# Patient Record
Sex: Female | Born: 1940
Health system: Southern US, Community
[De-identification: ages and names within clinical notes are randomized; demographics above are authoritative.]

## PROBLEM LIST (undated history)

## (undated) DIAGNOSIS — M51369 Other intervertebral disc degeneration, lumbar region without mention of lumbar back pain or lower extremity pain: Secondary | ICD-10-CM

## (undated) DIAGNOSIS — E785 Hyperlipidemia, unspecified: Secondary | ICD-10-CM

## (undated) DIAGNOSIS — C50911 Malignant neoplasm of unspecified site of right female breast: Secondary | ICD-10-CM

## (undated) DIAGNOSIS — K219 Gastro-esophageal reflux disease without esophagitis: Secondary | ICD-10-CM

## (undated) DIAGNOSIS — M797 Fibromyalgia: Secondary | ICD-10-CM

## (undated) DIAGNOSIS — K649 Unspecified hemorrhoids: Secondary | ICD-10-CM

## (undated) DIAGNOSIS — N83209 Unspecified ovarian cyst, unspecified side: Secondary | ICD-10-CM

## (undated) DIAGNOSIS — F419 Anxiety disorder, unspecified: Secondary | ICD-10-CM

## (undated) DIAGNOSIS — M5136 Other intervertebral disc degeneration, lumbar region: Secondary | ICD-10-CM

## (undated) DIAGNOSIS — E119 Type 2 diabetes mellitus without complications: Secondary | ICD-10-CM

## (undated) DIAGNOSIS — Z8673 Personal history of transient ischemic attack (TIA), and cerebral infarction without residual deficits: Secondary | ICD-10-CM

## (undated) DIAGNOSIS — I10 Essential (primary) hypertension: Secondary | ICD-10-CM

## (undated) HISTORY — DX: Other intervertebral disc degeneration, lumbar region without mention of lumbar back pain or lower extremity pain: M51.369

## (undated) HISTORY — DX: Unspecified hemorrhoids: K64.9

## (undated) HISTORY — DX: Other intervertebral disc degeneration, lumbar region: M51.36

## (undated) HISTORY — PX: BREAST LUMPECTOMY: SHX2

## (undated) HISTORY — PX: CATARACT EXTRACTION: SUR2

## (undated) HISTORY — PX: BREAST BIOPSY: SHX20

## (undated) HISTORY — DX: Unspecified ovarian cyst, unspecified side: N83.209

## (undated) HISTORY — DX: Personal history of transient ischemic attack (TIA), and cerebral infarction without residual deficits: Z86.73

---

## 1973-06-19 HISTORY — PX: TUBAL LIGATION: SHX77

## 2001-02-08 ENCOUNTER — Encounter: Admission: RE | Admit: 2001-02-08 | Discharge: 2001-02-08 | Payer: Self-pay | Admitting: Family Medicine

## 2001-02-08 ENCOUNTER — Encounter: Payer: Self-pay | Admitting: Family Medicine

## 2012-04-19 DIAGNOSIS — Z8673 Personal history of transient ischemic attack (TIA), and cerebral infarction without residual deficits: Secondary | ICD-10-CM

## 2012-04-19 HISTORY — DX: Personal history of transient ischemic attack (TIA), and cerebral infarction without residual deficits: Z86.73

## 2012-04-29 ENCOUNTER — Emergency Department (HOSPITAL_COMMUNITY)
Admission: EM | Admit: 2012-04-29 | Discharge: 2012-04-29 | Disposition: A | Discharge status: Home / Self Care | Payer: Medicare Other | Attending: Emergency Medicine | Admitting: Emergency Medicine

## 2012-04-29 ENCOUNTER — Emergency Department (HOSPITAL_COMMUNITY): Payer: Medicare Other

## 2012-04-29 ENCOUNTER — Encounter (HOSPITAL_COMMUNITY): Payer: Self-pay | Admitting: Emergency Medicine

## 2012-04-29 DIAGNOSIS — F411 Generalized anxiety disorder: Secondary | ICD-10-CM | POA: Insufficient documentation

## 2012-04-29 DIAGNOSIS — R05 Cough: Secondary | ICD-10-CM | POA: Insufficient documentation

## 2012-04-29 DIAGNOSIS — IMO0001 Reserved for inherently not codable concepts without codable children: Secondary | ICD-10-CM | POA: Insufficient documentation

## 2012-04-29 DIAGNOSIS — M6281 Muscle weakness (generalized): Secondary | ICD-10-CM | POA: Insufficient documentation

## 2012-04-29 DIAGNOSIS — R202 Paresthesia of skin: Secondary | ICD-10-CM

## 2012-04-29 DIAGNOSIS — R209 Unspecified disturbances of skin sensation: Secondary | ICD-10-CM | POA: Insufficient documentation

## 2012-04-29 DIAGNOSIS — Z79899 Other long term (current) drug therapy: Secondary | ICD-10-CM | POA: Insufficient documentation

## 2012-04-29 DIAGNOSIS — R51 Headache: Secondary | ICD-10-CM

## 2012-04-29 DIAGNOSIS — R059 Cough, unspecified: Secondary | ICD-10-CM | POA: Insufficient documentation

## 2012-04-29 DIAGNOSIS — E785 Hyperlipidemia, unspecified: Secondary | ICD-10-CM | POA: Insufficient documentation

## 2012-04-29 DIAGNOSIS — B379 Candidiasis, unspecified: Secondary | ICD-10-CM

## 2012-04-29 DIAGNOSIS — M542 Cervicalgia: Secondary | ICD-10-CM | POA: Insufficient documentation

## 2012-04-29 DIAGNOSIS — E119 Type 2 diabetes mellitus without complications: Secondary | ICD-10-CM | POA: Insufficient documentation

## 2012-04-29 DIAGNOSIS — I10 Essential (primary) hypertension: Secondary | ICD-10-CM | POA: Insufficient documentation

## 2012-04-29 HISTORY — DX: Type 2 diabetes mellitus without complications: E11.9

## 2012-04-29 HISTORY — DX: Essential (primary) hypertension: I10

## 2012-04-29 HISTORY — DX: Fibromyalgia: M79.7

## 2012-04-29 HISTORY — DX: Hyperlipidemia, unspecified: E78.5

## 2012-04-29 HISTORY — DX: Anxiety disorder, unspecified: F41.9

## 2012-04-29 LAB — COMPREHENSIVE METABOLIC PANEL
Alkaline Phosphatase: 68 U/L (ref 39–117)
BUN: 12 mg/dL (ref 6–23)
Chloride: 98 mEq/L (ref 96–112)
GFR calc Af Amer: >90 mL/min (ref >90)
Glucose, Bld: 142 mg/dL — ABNORMAL HIGH (ref 70–99)
Potassium: 3.9 mEq/L (ref 3.5–5.1)
Total Bilirubin: 0.2 mg/dL — ABNORMAL LOW (ref 0.3–1.2)

## 2012-04-29 LAB — CBC WITH DIFFERENTIAL/PLATELET
Eosinophils Absolute: 0.3 10*3/uL (ref 0.0–0.7)
HCT: 42.9 % (ref 36.0–46.0)
Hemoglobin: 14.7 g/dL (ref 12.0–15.0)
Lymphs Abs: 3.6 10*3/uL (ref 0.7–4.0)
MCH: 30.1 pg (ref 26.0–34.0)
Monocytes Absolute: 0.9 10*3/uL (ref 0.1–1.0)
Monocytes Relative: 7 % (ref 3–12)
Neutro Abs: 6.7 10*3/uL (ref 1.7–7.7)
Neutrophils Relative %: 59 % (ref 43–77)
RBC: 4.88 MIL/uL (ref 3.87–5.11)

## 2012-04-29 LAB — URINALYSIS, ROUTINE W REFLEX MICROSCOPIC
Bilirubin Urine: NEGATIVE
Ketones, ur: NEGATIVE mg/dL
Nitrite: NEGATIVE
Protein, ur: NEGATIVE mg/dL
Urobilinogen, UA: 0.2 mg/dL (ref 0.0–1.0)

## 2012-04-29 LAB — URINE MICROSCOPIC-ADD ON

## 2012-04-29 LAB — GLUCOSE, CAPILLARY: Glucose-Capillary: 146 mg/dL — ABNORMAL HIGH (ref 70–99)

## 2012-04-29 MED ORDER — ASPIRIN 81 MG PO CHEW
81.0000 mg | CHEWABLE_TABLET | Freq: Once | ORAL | Status: AC
  Administered 2012-04-29: 81 mg via ORAL
  Filled 2012-04-29: qty 1

## 2012-04-29 MED ORDER — FLUCONAZOLE 150 MG PO TABS
150.0000 mg | ORAL_TABLET | Freq: Once | ORAL | Status: AC
  Administered 2012-04-29: 150 mg via ORAL
  Filled 2012-04-29: qty 1

## 2012-04-29 NOTE — ED Notes (Addendum)
Pt presenting to ed with c/o headache pain since Saturday pt states she has not felt her normal self. Pt states she developed numbness and tingling to her right arm today while cooking and noticed she could not stir her cabbage. Pt denies chest pain nausea and vomiting at this time. Pt with no slurred speech at this time. Per pt's family pt sounds normal at this time. Pt with no facial droop noted at this time. Pt states she has had some periods of dizziness but denies at this time. Pt with no neuro deficits noted at this time. Pt states she had a recent change in her medication and she's not sure if this is the cause

## 2012-04-29 NOTE — ED Provider Notes (Addendum)
History     CSN: [id number]  Arrival date & time 04/29/12  1739   First MD Initiated Contact with Patient 04/29/12 1838      Chief Complaint  Patient presents with  . Headache  . Extremity Weakness    (Consider location/radiation/quality/duration/timing/severity/associated sxs/prior treatment) Patient is a 71 y.o. female presenting with headaches and extremity weakness. The history is provided by the patient.  Headache  Pertinent negatives include no fever, no palpitations, no shortness of breath, no nausea and no vomiting.  Extremity Weakness Associated symptoms include headaches. Pertinent negatives include no chest pain and no shortness of breath.   71 year old, female, presents emergency department complaining of headache, and weakness in her right arm.  She said the headache occurred 3 days ago.  It was accompanied by mild neck pain, and tingling in her right hand.  She took an analgesic and went to sleep in the next day.  The headache was resolved.  Now.  She has tingling in her right hand, and complains of decreased dexterity in her right hand.  She is right hand dominant.  She denies a headache, dizziness, vision changes.  She denies nausea, vomiting.  She has never had a stroke in the past.  She does have diabetes.  She denies prior history of myocardial infarction.  Past Medical History  Diagnosis Date  . Hypertension   . Hyperlipidemia   . Diabetes mellitus without complication   . Fibromyalgia   . Anxiety     History reviewed. No pertinent past surgical history.  No family history on file.  History  Substance Use Topics  . Smoking status: Never Smoker   . Smokeless tobacco: Not on file  . Alcohol Use: No    OB History    Grav Para Term Preterm Abortions TAB SAB Ect Mult Living                  Review of Systems  Constitutional: Negative for fever, chills and fatigue.  HENT: Positive for neck pain.        Neck pain.  3 days ago, is resolved  Eyes:  Negative for visual disturbance.  Respiratory: Positive for cough. Negative for shortness of breath.   Cardiovascular: Negative for chest pain, palpitations and leg swelling.  Gastrointestinal: Negative for nausea and vomiting.  Musculoskeletal: Positive for extremity weakness. Negative for back pain.  Skin: Negative for rash.  Neurological: Positive for weakness, numbness and headaches.       Headache resolved Numbness in right hand, along with decreased dexterity in right hand.  Both symptoms are resolved.  Now  Psychiatric/Behavioral: Negative for confusion.  All other systems reviewed and are negative.    Allergies  Ciprofloxacin; Citalopram; Codeine; and Maxzide  Home Medications   Current Outpatient Rx  Name  Route  Sig  Dispense  Refill  . ALPRAZOLAM 0.5 MG PO TABS   Oral   Take 0.5 mg by mouth 3 (three) times daily as needed. Anxiety.         Marland Kitchen DILTIAZEM HCL ER BEADS 360 MG PO CP24   Oral   Take 360 mg by mouth daily.         Marland Kitchen FLUTICASONE PROPIONATE 50 MCG/ACT NA SUSP   Nasal   Place 2 sprays into the nose daily.         Marland Kitchen GLIMEPIRIDE 2 MG PO TABS   Oral   Take 2 mg by mouth daily before breakfast.         .  PRAVASTATIN SODIUM 20 MG PO TABS   Oral   Take 20 mg by mouth daily.         . TRAMADOL HCL 50 MG PO TABS   Oral   Take 25 mg by mouth every 6 (six) hours as needed. Fibromyalgia pain.           BP 162/71  Pulse 96  Temp 98.2 F (36.8 C) (Oral)  Resp 18  SpO2 100%  Physical Exam  Nursing note and vitals reviewed. Constitutional: She is oriented to person, place, and time. She appears well-developed and well-nourished. No distress.  HENT:  Head: Normocephalic and atraumatic.  Right Ear: External ear normal.  Left Ear: External ear normal.  Mouth/Throat: Oropharynx is clear and moist. No oropharyngeal exudate.  Eyes: Conjunctivae normal and EOM are normal. Pupils are equal, round, and reactive to light.  Neck: Normal range of  motion. Neck supple. No JVD present.        No carotid bruits  Cardiovascular: Normal rate, regular rhythm and intact distal pulses.   No murmur heard. Pulmonary/Chest: Effort normal and breath sounds normal. No respiratory distress.  Abdominal: Soft. Bowel sounds are normal. She exhibits no distension. There is no tenderness.  Musculoskeletal: Normal range of motion. She exhibits no edema and no tenderness.  Neurological: She is alert and oriented to person, place, and time. No cranial nerve deficit. Coordination normal.       Strength 5 over 5 bilateral upper extremities.  Normal.  Light touch sensation in bilateral upper and lower extremity  Skin: Skin is warm and dry.  Psychiatric: She has a normal mood and affect. Thought content normal.    ED Course  Procedures (including critical care time) 71 year old, female, with diabetes mellitus, presents with a history of headache, neck pain, and numbness in right hand, with decreased dexterity.  Presently, her symptoms are resolved.  She has a normal mental status and normal.  Neurological examination.  Labs Reviewed  GLUCOSE, CAPILLARY - Abnormal; Notable for the following:    Glucose-Capillary 146 (*)     All other components within normal limits  CBC WITH DIFFERENTIAL  COMPREHENSIVE METABOLIC PANEL  URINALYSIS, ROUTINE W REFLEX MICROSCOPIC   No results found.   No diagnosis found.   Date: 04/29/2012  Rate: 94  Rhythm: normal sinus rhythm  QRS Axis: normal  Intervals: normal  ST/T Wave abnormalities: normal  Conduction Disutrbances: none  Narrative Interpretation: unremarkable  9:08 PM Still asx.  Will discuss with pcp, dr. Tiburcio Pea. [facility].  9:15 PM I spoke with Dr. Azucena Kuba.  He agreed with outpt workup for possible tia since pt is normal now and asx.  He will arrange f/u in office.   9:26 PM More hx.  Pt states she has vaginal itching.  She has dm.   This explains wbcs in urine with few bact and no rbcs MDM  Headache,  with right hand, numbness and decreased dexterity, resolved        Cheri Guppy, MD 04/29/12 1901  Cheri Guppy, MD 04/29/12 2100  Cheri Guppy, MD 04/29/12 9811  Cheri Guppy, MD 04/29/12 2127

## 2012-04-30 ENCOUNTER — Other Ambulatory Visit: Payer: Self-pay | Admitting: Family Medicine

## 2012-04-30 DIAGNOSIS — G459 Transient cerebral ischemic attack, unspecified: Secondary | ICD-10-CM

## 2012-05-01 ENCOUNTER — Ambulatory Visit
Admission: RE | Admit: 2012-05-01 | Discharge: 2012-05-01 | Disposition: A | Discharge status: Home / Self Care | Payer: Medicare Other | Source: Ambulatory Visit | Attending: Family Medicine | Admitting: Family Medicine

## 2012-05-01 DIAGNOSIS — G459 Transient cerebral ischemic attack, unspecified: Secondary | ICD-10-CM

## 2012-05-01 LAB — URINE CULTURE
Colony Count: NO GROWTH
Culture: NO GROWTH

## 2012-05-05 ENCOUNTER — Ambulatory Visit
Admission: RE | Admit: 2012-05-05 | Discharge: 2012-05-05 | Disposition: A | Discharge status: Home / Self Care | Payer: Medicare Other | Source: Ambulatory Visit | Attending: Family Medicine | Admitting: Family Medicine

## 2012-05-05 DIAGNOSIS — G459 Transient cerebral ischemic attack, unspecified: Secondary | ICD-10-CM

## 2012-07-26 ENCOUNTER — Ambulatory Visit
Admission: RE | Admit: 2012-07-26 | Discharge: 2012-07-26 | Disposition: A | Discharge status: Home / Self Care | Payer: Medicare Other | Source: Ambulatory Visit | Attending: Family Medicine | Admitting: Family Medicine

## 2012-07-26 ENCOUNTER — Other Ambulatory Visit: Payer: Self-pay | Admitting: Family Medicine

## 2012-07-26 DIAGNOSIS — J40 Bronchitis, not specified as acute or chronic: Secondary | ICD-10-CM

## 2012-07-26 DIAGNOSIS — R05 Cough: Secondary | ICD-10-CM

## 2012-07-26 DIAGNOSIS — R053 Chronic cough: Secondary | ICD-10-CM

## 2012-10-25 ENCOUNTER — Telehealth: Payer: Self-pay | Admitting: *Deleted

## 2012-10-25 NOTE — Telephone Encounter (Signed)
Patient will call to R/S her appointment patient need to find a ride.

## 2012-12-04 ENCOUNTER — Ambulatory Visit: Payer: Self-pay | Admitting: Neurology

## 2012-12-11 ENCOUNTER — Encounter: Payer: Self-pay | Admitting: Neurology

## 2012-12-11 ENCOUNTER — Ambulatory Visit (INDEPENDENT_AMBULATORY_CARE_PROVIDER_SITE_OTHER): Payer: Medicare Other | Admitting: Neurology

## 2012-12-11 VITALS — BP 172/96 | HR 96 | Ht 62.0 in | Wt 157.0 lb

## 2012-12-11 DIAGNOSIS — I6350 Cerebral infarction due to unspecified occlusion or stenosis of unspecified cerebral artery: Secondary | ICD-10-CM

## 2012-12-11 DIAGNOSIS — I10 Essential (primary) hypertension: Secondary | ICD-10-CM

## 2012-12-11 DIAGNOSIS — E119 Type 2 diabetes mellitus without complications: Secondary | ICD-10-CM

## 2012-12-11 DIAGNOSIS — I639 Cerebral infarction, unspecified: Secondary | ICD-10-CM

## 2012-12-11 NOTE — Progress Notes (Signed)
History of Present Illness:   Christina [name] is a 72 years old right-handed Caucasian female, referred by her primary care physician Dr. Tiburcio Pea for evaluation of acute stroke  She had a vascular risk factor of hypertension, hyperlipidemia, diabetes, most recent A1c was 7 point 5, LDL 64, she lives alone, but quit driving few years ago, ambulate without difficulty at baseline  In April 29 2012, while fixing supper, she noticed clumsiness of her right hand, drop things, also numbness involving her right hand, extending to her right arm, symptoms overall has improved over the past 1 months, she has residual mild clumsiness of her right hand, difficulty signing her name, fingertips numbness tingling, she denies gait difficulty, no dysarthria,  She was referred for MRI of the brain, done at [facility] May 05 2012, there was no acute right dorsal medullary infarction, subacute right inferior cerebellum infarction, mild to moderate supratentorium small vessel disease, carotid Doppler, mild plaque, no significant stenosis, echocardiogram was normal, she denies a history of irregular heart rate. She was put on aspirin 81 mg  UPDATE June 24th 2014: She has numbness at her right finger tips, she denies weakness, she is able to carry grocery using her right hand, she denies gait difficulty, she also has right knee bursitis, complains of right knee pain, mild gait difficulty.   Physical Exam  Neck: supple no carotid bruits Respiratory: clear to auscultation bilaterally Cardiovascular: regular rate rhythm  Neurologic Exam  Mental Status: pleasant, awake, alert, cooperative to history, talking, and casual conversation. Cranial Nerves: CN II-XII pupils were equal round reactive to light.  Fundi were sharp bilaterally.  Extraocular movements were full.  Visual fields were full on confrontational test.  Facial sensation and strength were normal.  Hearing was intact to finger rubbing bilaterally.   Uvula tongue were midline.  Head turning and shoulder shrugging were normal and symmetric.  Tongue protrusion into the cheeks strength were normal.  Motor: Normal tone, bulk, and strength, with exception of mild weak grip of her right hand 5-. Sensory: Normal to light touch, pinprick, proprioception, and vibratory sensation. Coordination: Normal finger-to-nose, heel-to-shin.  There was no dysmetria noticed. Gait and Station: Narrow based and steady, was able to perform tiptoe, heel, and tandem walking without difficulty.  Romberg sign: Negative Reflexes: Deep tendon reflexes: Biceps: 2/2, Brachioradialis: 2/2, Triceps: 2/2, Pateller: 2/2, Achilles: 2/2.  Plantar responses are flexor.   Assessment and Plan:  72 years old right-handed Caucasian female, with vascular risk factor of hypertension, diabetes, hyperlipidemia, presenting with acute onset right arm weakness, and numbness, overall has improved, MRI of brain 6 days after symptom onset has demonstrate an acute stroke in the right dorsal medulla, there was also evidence of subacute right cerebellum stroke, chronic supratentorial small vessel disease, echocardiogram was normal, carotid Doppler showed mild plaques, no significant stenosis, she is overall doing well, no recurrent symptoms, highly functional.  1. continue daily aspirin 2.  Her diabetes is under better control now 3. exercise, hydration, goal blood pressure less than 130/80, LDL less than 70 4. Return to clinic as needed.

## 2013-04-19 DIAGNOSIS — N83209 Unspecified ovarian cyst, unspecified side: Secondary | ICD-10-CM

## 2013-04-19 HISTORY — DX: Unspecified ovarian cyst, unspecified side: N83.209

## 2013-08-03 ENCOUNTER — Ambulatory Visit (INDEPENDENT_AMBULATORY_CARE_PROVIDER_SITE_OTHER): Payer: Medicare Other | Admitting: Emergency Medicine

## 2013-08-03 ENCOUNTER — Other Ambulatory Visit: Payer: Self-pay | Admitting: *Deleted

## 2013-08-03 VITALS — BP 140/80 | HR 88 | Temp 98.0°F | Resp 16 | Ht 63.5 in | Wt 151.1 lb

## 2013-08-03 DIAGNOSIS — M5430 Sciatica, unspecified side: Secondary | ICD-10-CM

## 2013-08-03 MED ORDER — HYDROCODONE-ACETAMINOPHEN 5-325 MG PO TABS: ORAL_TABLET | ORAL | Status: DC

## 2013-08-03 MED ORDER — ACETAMINOPHEN-CODEINE #3 300-30 MG PO TABS: 1.0000 | ORAL_TABLET | ORAL | Status: DC | PRN

## 2013-08-03 NOTE — Progress Notes (Signed)
[facility] [address], [place] [address] [phone]  Date:  08/03/2013   Name:  Christina [name]   DOB:  June 11, 1941   MRN:  [id number]  PCP:  Shirline Frees, MD    Chief Complaint: Hip Pain   History of Present Illness:  Christina [name] is a 73 y.o. very pleasant female patient who presents with the following:  History of shingles this past year.  Coincidentally, she developed pain in the left buttock that radiates into her left leg to the ankle.   Pain increases with walking and standing. pain worse with sneezing.  No incontinence.  Pain less with sitting.  Had MRI which they believe to have been negative.  Has been evaluated by neurologist and neurosurgeon.  No notes are available.  No history of injury or overuse.  Pain present intermittently over past 8 months or so.  Now has worsened.  No improvement with over the counter medications or other home remedies. Denies other complaint or health concern today.   Patient Active Problem List   Diagnosis Date Noted  . Stroke 12/11/2012  . Essential hypertension, benign 12/11/2012  . DM (diabetes mellitus) 12/11/2012    Past Medical History  Diagnosis Date  . Hypertension   . Hyperlipidemia   . Diabetes mellitus without complication   . Fibromyalgia   . Anxiety     Past Surgical History  Procedure Laterality Date  . Cataract extraction Bilateral     History  Substance Use Topics  . Smoking status: Never Smoker   . Smokeless tobacco: Never Used  . Alcohol Use: No    Family History  Problem Relation Age of Onset  . Lung cancer Father   . Heart Problems Mother   . High blood pressure Mother     Allergies  Allergen Reactions  . Ciprofloxacin   . Citalopram   . Codeine   . Maxzide [Hydrochlorothiazide W-Triamterene]     Medication list has been reviewed and updated.  Current Outpatient Prescriptions on File Prior to Visit  Medication Sig Dispense Refill  . ALPRAZolam (XANAX) 0.5 MG  tablet Take 0.5 mg by mouth 3 (three) times daily as needed. Anxiety.      Marland Kitchen aspirin 81 MG tablet Take 81 mg by mouth daily.      Marland Kitchen diltiazem (CARDIZEM CD) 360 MG 24 hr capsule 360 mg daily.      Marland Kitchen diltiazem (TIAZAC) 360 MG 24 hr capsule Take 360 mg by mouth daily.      . fluticasone (FLONASE) 50 MCG/ACT nasal spray Place 2 sprays into the nose as needed.       Marland Kitchen glimepiride (AMARYL) 2 MG tablet Take 2 mg by mouth daily before breakfast.      . metFORMIN (GLUCOPHAGE-XR) 500 MG 24 hr tablet 500 mg daily.      . pravastatin (PRAVACHOL) 20 MG tablet Take 20 mg by mouth daily.      . traMADol (ULTRAM) 50 MG tablet Take 25 mg by mouth every 6 (six) hours as needed. Fibromyalgia pain.       No current facility-administered medications on file prior to visit.    Review of Systems:  As per HPI, otherwise negative.    Physical Examination: Filed Vitals:   08/03/13 1449  BP: 140/80  Pulse: 88  Temp: 98 F (36.7 C)  Resp: 16   Filed Vitals:   08/03/13 1449  Height: 5' 3.5" (1.613 m)  Weight: 151 lb 2 oz (68.55  kg)   Body mass index is 26.35 kg/(m^2). Ideal Body Weight: Weight in (lb) to have BMI = 25: 143.1  GEN: WDWN, NAD, Non-toxic, A & O x 3 HEENT: Atraumatic, Normocephalic. Neck supple. No masses, No LAD. Ears and Nose: No external deformity. CV: RRR, No M/G/R. No JVD. No thrill. No extra heart sounds. PULM: CTA B, no wheezes, crackles, rhonchi. No retractions. No resp. distress. No accessory muscle use. ABD: S, NT, ND, +BS. No rebound. No HSM. EXTR: No c/c/e NEURO antalgic gait.  Left lower hyperreflexic and some weakness.   PSYCH: Normally interactive. Conversant. Not depressed or anxious appearing.  Calm demeanor.  Left buttock:  Tender sciatic notch.  Assessment and Plan: Sciatic neuritis Tylenol #3 Follow up FMD   Signed,  Ellison Carwin, MD

## 2013-08-03 NOTE — Patient Instructions (Signed)
Sciatica °Sciatica is pain, weakness, numbness, or tingling along the path of the sciatic nerve. The nerve starts in the lower back and runs down the back of each leg. The nerve controls the muscles in the lower leg and in the back of the knee, while also providing sensation to the back of the thigh, lower leg, and the sole of your foot. Sciatica is a symptom of another medical condition. For instance, nerve damage or certain conditions, such as a herniated disk or bone spur on the spine, pinch or put pressure on the sciatic nerve. This causes the pain, weakness, or other sensations normally associated with sciatica. Generally, sciatica only affects one side of the body. °CAUSES  °· Herniated or slipped disc. °· Degenerative disk disease. °· A pain disorder involving the narrow muscle in the buttocks (piriformis syndrome). °· Pelvic injury or fracture. °· Pregnancy. °· Tumor (rare). °SYMPTOMS  °Symptoms can vary from mild to very severe. The symptoms usually travel from the low back to the buttocks and down the back of the leg. Symptoms can include: °· Mild tingling or dull aches in the lower back, leg, or hip. °· Numbness in the back of the calf or sole of the foot. °· Burning sensations in the lower back, leg, or hip. °· Sharp pains in the lower back, leg, or hip. °· Leg weakness. °· Severe back pain inhibiting movement. °These symptoms may get worse with coughing, sneezing, laughing, or prolonged sitting or standing. Also, being overweight may worsen symptoms. °DIAGNOSIS  °Your caregiver will perform a physical exam to look for common symptoms of sciatica. He or she may ask you to do certain movements or activities that would trigger sciatic nerve pain. Other tests may be performed to find the cause of the sciatica. These may include: °· Blood tests. °· X-rays. °· Imaging tests, such as an MRI or CT scan. °TREATMENT  °Treatment is directed at the cause of the sciatic pain. Sometimes, treatment is not necessary  and the pain and discomfort goes away on its own. If treatment is needed, your caregiver may suggest: °· Over-the-counter medicines to relieve pain. °· Prescription medicines, such as anti-inflammatory medicine, muscle relaxants, or narcotics. °· Applying heat or ice to the painful area. °· Steroid injections to lessen pain, irritation, and inflammation around the nerve. °· Reducing activity during periods of pain. °· Exercising and stretching to strengthen your abdomen and improve flexibility of your spine. Your caregiver may suggest losing weight if the extra weight makes the back pain worse. °· Physical therapy. °· Surgery to eliminate what is pressing or pinching the nerve, such as a bone spur or part of a herniated disk. °HOME CARE INSTRUCTIONS  °· Only take over-the-counter or prescription medicines for pain or discomfort as directed by your caregiver. °· Apply ice to the affected area for 20 minutes, 3 4 times a day for the first 48 72 hours. Then try heat in the same way. °· Exercise, stretch, or perform your usual activities if these do not aggravate your pain. °· Attend physical therapy sessions as directed by your caregiver. °· Keep all follow-up appointments as directed by your caregiver. °· Do not wear high heels or shoes that do not provide proper support. °· Check your mattress to see if it is too soft. A firm mattress may lessen your pain and discomfort. °SEEK IMMEDIATE MEDICAL CARE IF:  °· You lose control of your bowel or bladder (incontinence). °· You have increasing weakness in the lower back,   pelvis, buttocks, or legs. °· You have redness or swelling of your back. °· You have a burning sensation when you urinate. °· You have pain that gets worse when you lie down or awakens you at night. °· Your pain is worse than you have experienced in the past. °· Your pain is lasting longer than 4 weeks. °· You are suddenly losing weight without reason. °MAKE SURE YOU: °· Understand these  instructions. °· Will watch your condition. °· Will get help right away if you are not doing well or get worse. °Document Released: 05/30/2001 Document Revised: 12/05/2011 Document Reviewed: 10/15/2011 °[employer]® Patient Information ©2014 [employer]. ° °

## 2013-12-31 ENCOUNTER — Telehealth: Payer: Self-pay | Admitting: Family Medicine

## 2013-12-31 NOTE — Telephone Encounter (Signed)
Where not patients PCP gets medicine from somewhere else

## 2014-03-31 ENCOUNTER — Encounter: Payer: Self-pay | Admitting: General Surgery

## 2014-03-31 DIAGNOSIS — E785 Hyperlipidemia, unspecified: Secondary | ICD-10-CM

## 2014-12-28 ENCOUNTER — Encounter: Payer: Self-pay | Admitting: Gynecologic Oncology

## 2014-12-28 ENCOUNTER — Ambulatory Visit: Payer: Medicare Other | Attending: Gynecologic Oncology | Admitting: Gynecologic Oncology

## 2014-12-28 VITALS — BP 169/80 | HR 109 | Temp 97.5°F | Resp 18 | Ht 63.0 in | Wt 155.9 lb

## 2014-12-28 DIAGNOSIS — N83209 Unspecified ovarian cyst, unspecified side: Secondary | ICD-10-CM

## 2014-12-28 DIAGNOSIS — N8320 Unspecified ovarian cysts: Secondary | ICD-10-CM | POA: Diagnosis present

## 2014-12-28 NOTE — Patient Instructions (Signed)
Please call Dr. Deatra Ina to schedule a repeat ultrasound and blood test in 3 months.  We will review the results and call to discuss next steps in your care. Please call our clinic before that time for any questions or new or worsening symptoms.

## 2014-12-28 NOTE — Progress Notes (Signed)
Consult Note: Gyn-Onc  Consult was requested by Dr. Deatra Ina for the evaluation of Christina [name] 74 y.o. female with a 10cm ovarian cyst  CC:  Chief Complaint  Patient presents with  . Ovarian Cyst    Assessment/Plan:  Ms. Christina [name]  is a 74 y.o.  year old with what most likely is a benign right ovarian cyst measuring 10 cm with two x one centimeter nodular areas. Her CA-125 is normal range at 10 units per milliliter.  I performed a history, physical examination, and personally reviewed the patient's imaging films including the [place] from 11/08/14.  I discussed with Christina [name] at that I have a low suspicion that this represents an ovarian malignancy. Certainly there is been very subtle growth in the cystic component of the ovarian cyst and potentially the addition of an new second nodule area since her original ultrasound in December 2015. However overall its features are reassuring. I stated that I would be happy to confirm the benign nature of this with the surgical intervention which would involve of robotic-assisted total laparoscopic bilateral salpingo-oophorectomy, and frozen section of the right ovary. I discussed that overall she is at low risk for such a procedure given her general good health and prior minimal surgeries. However I discussed that all surgical interventions are associated with some perioperative risk including bleeding, infection, damage to internal organs. I discussed that it was her decision regarding whether or not she would like to repeat imaging of the cyst an additional 3 months or proceed at this point in time with surgery.   The patient feels that a more conservative approach is best at this time with repeat ultrasound scan. I recommend this being performed in 3 months time. If there is progressive growth or complexity in the cyst demonstrated this time I would recommend no further surveillance image but instead proceed with surgery. I would also recommend a CA-125 be  drawn at that time and if there was progressive elevation within the CA-125 albeit within normal limits I would also recommend moving to surgery without additional surveillance. However if everything remains stable on this follow-up imaging, and she continued to have concerns about surgical risk, it would be reasonable to delay surgery at that time.  We will send her back to Dr. Deatra Ina for an ultrasound scan of the ovaries in October 2016 and a repeat CA-125. I discussed potential symptoms of ovarian cancer with Christina [name] including abdominal bloating, early satiety, new pelvic pains, change in bladder or bowel habit. I discussed that she should notify myself or Dr. Deatra Ina of these emerge. I discussed with the patient that if she changes her mind about proceeding more immediately with surgery between now and her follow-up ultrasound scan that I would be happy to schedule her surgery at that time.   HPI: Ms. Christina [name] is a 74 year old G4 P4 with uterine prolapse and an enlarging right ovarian cyst who is seen in consultation at the request of Dr. Deatra Ina for these issues. The patient has a history of sciatic pain in 2015 which prompted an MRI of the pelvis to evaluate the lumbar spine. This identified a right ovarian cystic mass which was then further characterized on December 2015 ultrasound scan. That ultrasound revealed a 9 cm simple right ovarian cyst with a 1 cm calcified nodule. CA-125 was normal. A recommendation was made after telephone consultation with me to repeat imaging in 6 months. This risk repeated on 12/09/2014 with a transvaginal ultrasound Dr. Kelby Fam office. This revealed  a grossly normal uterus measuring 5.5 x 4 x 2.6 cm with an endometrial thickness 2.6 mm. The left ovary was grossly normal in dimensions did not contain a cyst. The right ovary in Christina [name] 10.9 x 7.7 x 7.3 cm simple cyst with a posterior 1.3 cm calcification into soft tissue areas internally along the wall. There was no  increase in blood flow seen in no free fluid. A CA-125 was drawn on 12/09/2014 and was normal at 10 units per milliliter.  The patient denies any symptoms of ovarian cancer including no bloating, early satiety, pelvic symptoms or change in bladder or bowel habit. She is otherwise fairly healthy. She's had uterine prolapse after 4 vaginal deliveries and this is treated adequately with a Gellhorn pessary which completely alleviates her symptoms. Her only abdominal surgery in the past as a tubal ligation. She is treated for type 2 diabetes mellitus, fibromyalgia, hypertension, hypercholesterolemia, anxiety, and recent TVA's approximate 3 years ago. This was followed up with carotid ultrasound which was normal. She has residual numbness in her right fingertips as a result of the CVA symptoms. She is no significant family history for malignancy.   Current Meds:  Outpatient Encounter Prescriptions as of 12/28/2014  Medication Sig  . ALPRAZolam (XANAX) 0.5 MG tablet Take 0.5 mg by mouth 3 (three) times daily as needed. Anxiety.  Marland Kitchen aspirin 81 MG tablet Take 81 mg by mouth daily.  Marland Kitchen diltiazem (CARDIZEM CD) 360 MG 24 hr capsule 360 mg daily.  . fluticasone (FLONASE) 50 MCG/ACT nasal spray Place 2 sprays into the nose as needed.   Marland Kitchen glimepiride (AMARYL) 2 MG tablet Take 2 mg by mouth daily before breakfast.  . metFORMIN (GLUCOPHAGE-XR) 500 MG 24 hr tablet 500 mg daily.  . pravastatin (PRAVACHOL) 20 MG tablet Take 20 mg by mouth daily.  . traMADol (ULTRAM) 50 MG tablet Take 50 mg by mouth every 6 (six) hours as needed. Fibromyalgia pain.  . Difluprednate (DUREZOL) 0.05 % EMUL Apply 1 drop to eye 2 (two) times daily.  . ERYTHROMYCIN OP Apply 3.5 g to eye daily. To left eye  . meloxicam (MOBIC) 15 MG tablet Take 15 mg by mouth daily.  . nabumetone (RELAFEN) 500 MG tablet Take 500 mg by mouth 2 (two) times daily as needed for mild pain.  Marland Kitchen TAZTIA XT 360 MG 24 hr capsule TK ONE C PO ONCE D  . valACYclovir  (VALTREX) 1000 MG tablet Take 1,000 mg by mouth 2 (two) times daily.   No facility-administered encounter medications on file as of 12/28/2014.    Allergy:  Allergies  Allergen Reactions  . Ciprofloxacin     Numbness   . Citalopram     Intolerant  . Codeine   . Maxzide [Hydrochlorothiazide W-Triamterene]   . Septra Ds [Sulfamethoxazole-Trimethoprim] Rash    Social Hx:   History   Social History  . Marital Status: Widowed    Spouse Name: N/A  . Number of Children: 4  . Years of Education: 12   Occupational History  .      retired   Social History Main Topics  . Smoking status: Never Smoker   . Smokeless tobacco: Never Used  . Alcohol Use: No  . Drug Use: No  . Sexual Activity: Not on file   Other Topics Concern  . Not on file   Social History Narrative   Patient is a widow. Patient lives at home alone. Patient is retired.    Right handed.   Caffeine-  two cups daily.    Past Surgical Hx:  Past Surgical History  Procedure Laterality Date  . Cataract extraction Bilateral   . Tubal ligation  1975    after last childbirth    Past Medical Hx:  Past Medical History  Diagnosis Date  . Hypertension   . Hyperlipidemia   . Diabetes mellitus without complication   . Fibromyalgia   . Anxiety   . Hemorrhoids   . H/O: CVA (cerebrovascular accident) 04/2012  . Ovarian cystic mass 11/14    R, nl CA-125, followed by Dr Deatra Ina.  . DDD (degenerative disc disease), lumbar     w spinal stenosis    Past Gynecological History:  SVD x 4, tubal ligation. Vaginal/uterine prolapse treated with pessary  No LMP recorded. Patient is postmenopausal.  Family Hx:  Family History  Problem Relation Age of Onset  . Lung cancer Father   . Heart Problems Mother   . High blood pressure Mother   . Heart attack Mother   . Heart attack Maternal Grandmother   . Heart attack Maternal Grandfather     Review of Systems:  Constitutional  Feels well,    ENT Normal appearing ears  and nares bilaterally Skin/Breast  No rash, sores, jaundice, itching, dryness Cardiovascular  No chest pain, shortness of breath, or edema  Pulmonary  No cough or wheeze.  Gastro Intestinal  No nausea, vomitting, or diarrhoea. No bright red blood per rectum, no abdominal pain, change in bowel movement, or constipation.  Genito Urinary  No frequency, urgency, dysuria, see HPI Musculo Skeletal  + myalgia, arthralgia, no joint swelling or pain  Neurologic  Numbness right fingertips Psychology  + anxiety.   Vitals:  Blood pressure 169/80, pulse 109, temperature 97.5 F (36.4 C), temperature source Oral, resp. rate 18, height 5\' 3"  (1.6 m), weight 155 lb 14.4 oz (70.716 kg), SpO2 98 %.  Physical Exam: WD in NAD Neck  Supple NROM, without any enlargements.  Lymph Node Survey No cervical supraclavicular or inguinal adenopathy Cardiovascular  Pulse normal rate, regularity and rhythm. S1 and S2 normal.  Lungs  Clear to auscultation bilateraly, without wheezes/crackles/rhonchi. Good air movement.  Skin  No rash/lesions/breakdown  Psychiatry  Alert and oriented to person, place, and time  Abdomen  Normoactive bowel sounds, abdomen soft, non-tender and overweight without evidence of hernia.  Back No CVA tenderness Genito Urinary  Vulva/vagina: Normal external female genitalia.  No lesions. No discharge or bleeding.  Bladder/urethra:  No lesions or masses, well supported bladder  Vagina: laxity. Pessary in situ (not removed.  Cervix: difficult to palpate with pessary in situ  Uterus: Small, mobile, no parametrial involvement or nodularity.  Adnexa: no palpable masses. Rectal  Good tone, no masses no cul de sac nodularity.  Extremities  No bilateral cyanosis, clubbing or edema.   Donaciano Eva, MD   12/28/2014, 9:43 AM

## 2015-07-22 DIAGNOSIS — H524 Presbyopia: Secondary | ICD-10-CM | POA: Diagnosis not present

## 2015-09-15 DIAGNOSIS — B0229 Other postherpetic nervous system involvement: Secondary | ICD-10-CM | POA: Diagnosis not present

## 2015-09-15 DIAGNOSIS — F419 Anxiety disorder, unspecified: Secondary | ICD-10-CM | POA: Diagnosis not present

## 2015-09-15 DIAGNOSIS — E7800 Pure hypercholesterolemia, unspecified: Secondary | ICD-10-CM | POA: Diagnosis not present

## 2015-09-15 DIAGNOSIS — Z7984 Long term (current) use of oral hypoglycemic drugs: Secondary | ICD-10-CM | POA: Diagnosis not present

## 2015-09-15 DIAGNOSIS — E1165 Type 2 diabetes mellitus with hyperglycemia: Secondary | ICD-10-CM | POA: Diagnosis not present

## 2015-09-15 DIAGNOSIS — I10 Essential (primary) hypertension: Secondary | ICD-10-CM | POA: Diagnosis not present

## 2015-09-15 DIAGNOSIS — M797 Fibromyalgia: Secondary | ICD-10-CM | POA: Diagnosis not present

## 2015-09-15 DIAGNOSIS — J0110 Acute frontal sinusitis, unspecified: Secondary | ICD-10-CM | POA: Diagnosis not present

## 2015-09-29 DIAGNOSIS — E119 Type 2 diabetes mellitus without complications: Secondary | ICD-10-CM | POA: Diagnosis not present

## 2015-10-06 DIAGNOSIS — N83201 Unspecified ovarian cyst, right side: Secondary | ICD-10-CM | POA: Diagnosis not present

## 2015-10-06 DIAGNOSIS — N763 Subacute and chronic vulvitis: Secondary | ICD-10-CM | POA: Diagnosis not present

## 2015-12-08 DIAGNOSIS — E782 Mixed hyperlipidemia: Secondary | ICD-10-CM | POA: Diagnosis not present

## 2015-12-08 DIAGNOSIS — E1165 Type 2 diabetes mellitus with hyperglycemia: Secondary | ICD-10-CM | POA: Diagnosis not present

## 2015-12-08 DIAGNOSIS — B0229 Other postherpetic nervous system involvement: Secondary | ICD-10-CM | POA: Diagnosis not present

## 2015-12-08 DIAGNOSIS — I10 Essential (primary) hypertension: Secondary | ICD-10-CM | POA: Diagnosis not present

## 2015-12-08 DIAGNOSIS — F419 Anxiety disorder, unspecified: Secondary | ICD-10-CM | POA: Diagnosis not present

## 2015-12-08 DIAGNOSIS — M797 Fibromyalgia: Secondary | ICD-10-CM | POA: Diagnosis not present

## 2015-12-08 DIAGNOSIS — Z7984 Long term (current) use of oral hypoglycemic drugs: Secondary | ICD-10-CM | POA: Diagnosis not present

## 2016-03-08 DIAGNOSIS — F419 Anxiety disorder, unspecified: Secondary | ICD-10-CM | POA: Diagnosis not present

## 2016-03-08 DIAGNOSIS — E1165 Type 2 diabetes mellitus with hyperglycemia: Secondary | ICD-10-CM | POA: Diagnosis not present

## 2016-03-08 DIAGNOSIS — Z7984 Long term (current) use of oral hypoglycemic drugs: Secondary | ICD-10-CM | POA: Diagnosis not present

## 2016-03-08 DIAGNOSIS — Z23 Encounter for immunization: Secondary | ICD-10-CM | POA: Diagnosis not present

## 2016-03-08 DIAGNOSIS — B0229 Other postherpetic nervous system involvement: Secondary | ICD-10-CM | POA: Diagnosis not present

## 2016-03-08 DIAGNOSIS — E782 Mixed hyperlipidemia: Secondary | ICD-10-CM | POA: Diagnosis not present

## 2016-03-08 DIAGNOSIS — N3000 Acute cystitis without hematuria: Secondary | ICD-10-CM | POA: Diagnosis not present

## 2016-03-08 DIAGNOSIS — M797 Fibromyalgia: Secondary | ICD-10-CM | POA: Diagnosis not present

## 2016-03-08 DIAGNOSIS — I10 Essential (primary) hypertension: Secondary | ICD-10-CM | POA: Diagnosis not present

## 2016-04-12 DIAGNOSIS — N83209 Unspecified ovarian cyst, unspecified side: Secondary | ICD-10-CM | POA: Diagnosis not present

## 2016-04-12 DIAGNOSIS — Z4689 Encounter for fitting and adjustment of other specified devices: Secondary | ICD-10-CM | POA: Diagnosis not present

## 2016-04-12 DIAGNOSIS — R1909 Other intra-abdominal and pelvic swelling, mass and lump: Secondary | ICD-10-CM | POA: Diagnosis not present

## 2016-05-20 ENCOUNTER — Ambulatory Visit (INDEPENDENT_AMBULATORY_CARE_PROVIDER_SITE_OTHER): Payer: Medicare Other

## 2016-05-20 ENCOUNTER — Ambulatory Visit (INDEPENDENT_AMBULATORY_CARE_PROVIDER_SITE_OTHER): Payer: Medicare Other | Admitting: Emergency Medicine

## 2016-05-20 VITALS — BP 142/80 | HR 109 | Temp 98.0°F | Resp 16 | Ht 63.0 in | Wt 154.6 lb

## 2016-05-20 DIAGNOSIS — R0789 Other chest pain: Secondary | ICD-10-CM | POA: Diagnosis not present

## 2016-05-20 DIAGNOSIS — J0100 Acute maxillary sinusitis, unspecified: Secondary | ICD-10-CM | POA: Diagnosis not present

## 2016-05-20 DIAGNOSIS — R05 Cough: Secondary | ICD-10-CM

## 2016-05-20 DIAGNOSIS — R059 Cough, unspecified: Secondary | ICD-10-CM

## 2016-05-20 DIAGNOSIS — J302 Other seasonal allergic rhinitis: Secondary | ICD-10-CM

## 2016-05-20 LAB — POCT CBC
Granulocyte percent: 58.8 %G (ref 37–80)
HCT, POC: 43.2 % (ref 37.7–47.9)
HEMOGLOBIN: 14.9 g/dL (ref 12.2–16.2)
LYMPH, POC: 3.3 (ref 0.6–3.4)
MCH, POC: 30.4 pg (ref 27–31.2)
MCHC: 34.5 g/dL (ref 31.8–35.4)
MCV: 88.1 fL (ref 80–97)
MID (cbc): 0.6 (ref 0–0.9)
MPV: 7.1 fL (ref 0–99.8)
PLATELET COUNT, POC: 270 10*3/uL (ref 142–424)
POC GRANULOCYTE: 5.6 (ref 2–6.9)
POC LYMPH %: 34.4 % (ref 10–50)
POC MID %: 6.8 %M (ref 0–12)
RBC: 4.91 M/uL (ref 4.04–5.48)
RDW, POC: 13 %
WBC: 9.5 10*3/uL (ref 4.6–10.2)

## 2016-05-20 MED ORDER — BENZONATATE 100 MG PO CAPS: 100.0000 mg | ORAL_CAPSULE | Freq: Three times a day (TID) | ORAL | 0 refills | Status: DC | PRN

## 2016-05-20 MED ORDER — FLUTICASONE PROPIONATE 50 MCG/ACT NA SUSP: 2.0000 | Freq: Every day | NASAL | 6 refills | Status: DC

## 2016-05-20 MED ORDER — AMOXICILLIN 875 MG PO TABS: 875.0000 mg | ORAL_TABLET | Freq: Two times a day (BID) | ORAL | 0 refills | Status: DC

## 2016-05-20 NOTE — Progress Notes (Addendum)
By signing my name below, I, Raven Small, attest that this documentation has been prepared under the direction and in the presence of Arlyss Queen, MD.  Electronically Signed: Thea Alken, ED Scribe. 05/20/2016. 8:16 AM.  Chief Complaint:  Chief Complaint  Patient presents with  . Cough    sneezing, runny nose, watery eyes, chest/back tightness on left side x 1 week    HPI Christina [name] is a 75 y.o. female who reports to [facility] today complaining of a cough onset 2 days ago. cough. Pt states symptoms started with sneeze 3 days ago. The following day she developed cough, watery eyes and rhinorrhea. She tried OTC with mild relief to cough.  Cough is now productive with associated chest tightness. She denies changes at home; no new pets. She denies fever, chills and wheeze. Pt is not a smoker. She lives alone; her husband passed away 7 years ago.  Pt is allergic to cipro and sulfa, but tolerates amoxicillin.   Past Medical History:  Diagnosis Date  . Anxiety   . DDD (degenerative disc disease), lumbar    w spinal stenosis  . Diabetes mellitus without complication ([place])   . Fibromyalgia   . H/O: CVA (cerebrovascular accident) 04/2012  . Hemorrhoids   . Hyperlipidemia   . Hypertension   . Ovarian cystic mass 11/14   R, nl CA-125, followed by Dr Deatra Ina.   Past Surgical History:  Procedure Laterality Date  . CATARACT EXTRACTION Bilateral   . TUBAL LIGATION  1975   after last childbirth   Social History   Social History  . Marital status: Widowed    Spouse name: N/A  . Number of children: 4  . Years of education: 12   Occupational History  .      retired   Social History Main Topics  . Smoking status: Never Smoker  . Smokeless tobacco: Never Used  . Alcohol use No  . Drug use: No  . Sexual activity: Not Asked   Other Topics Concern  . None   Social History Narrative   Patient is a widow. Patient lives at home alone. Patient is retired.    Right handed.   Caffeine-  two cups daily.   Family History  Problem Relation Age of Onset  . Lung cancer Father   . Heart Problems Mother   . High blood pressure Mother   . Heart attack Mother   . Heart attack Maternal Grandmother   . Heart attack Maternal Grandfather    Allergies  Allergen Reactions  . Ciprofloxacin     Numbness   . Citalopram     Intolerant  . Codeine   . Maxzide [Hydrochlorothiazide W-Triamterene]   . Septra Ds [Sulfamethoxazole-Trimethoprim] Rash   Prior to Admission medications   Medication Sig Start Date End Date Taking? Authorizing Provider  ALPRAZolam Duanne Moron) 0.5 MG tablet Take 0.5 mg by mouth 3 (three) times daily as needed. Anxiety.   Yes Historical Provider, MD  aspirin 81 MG tablet Take 81 mg by mouth daily.   Yes Historical Provider, MD  glimepiride (AMARYL) 2 MG tablet Take 2 mg by mouth daily before breakfast.   Yes Historical Provider, MD  meloxicam (MOBIC) 15 MG tablet Take 15 mg by mouth daily.   Yes Historical Provider, MD  metFORMIN (GLUCOPHAGE-XR) 500 MG 24 hr tablet 500 mg daily. 09/13/12  Yes Historical Provider, MD  nabumetone (RELAFEN) 500 MG tablet Take 500 mg by mouth 2 (two) times daily as needed for  mild pain.   Yes Historical Provider, MD  pravastatin (PRAVACHOL) 20 MG tablet Take 20 mg by mouth daily.   Yes Historical Provider, MD  traMADol (ULTRAM) 50 MG tablet Take 50 mg by mouth every 6 (six) hours as needed. Fibromyalgia pain.   Yes Historical Provider, MD  valACYclovir (VALTREX) 1000 MG tablet Take 1,000 mg by mouth 2 (two) times daily.   Yes Historical Provider, MD  Difluprednate (DUREZOL) 0.05 % EMUL Apply 1 drop to eye 2 (two) times daily.    Historical Provider, MD  diltiazem (CARDIZEM CD) 360 MG 24 hr capsule 360 mg daily. 10/12/12   Historical Provider, MD  ERYTHROMYCIN OP Apply 3.5 g to eye daily. To left eye    Historical Provider, MD  fluticasone (FLONASE) 50 MCG/ACT nasal spray Place 2 sprays into the nose as needed.     Historical Provider, MD    TAZTIA XT 360 MG 24 hr capsule TK ONE C PO ONCE D 12/18/14   Historical Provider, MD     ROS: The patient denies fevers, chills, night sweats, unintentional weight loss, palpitations, wheezing, dyspnea on exertion, nausea, vomiting, abdominal pain, dysuria, hematuria, melena, numbness, weakness, or tingling. Positive: Cough, chest tightness, rhinorrhea, watery eyes, sneeze  All other systems have been reviewed and were otherwise negative with the exception of those mentioned in the HPI and as above.    PHYSICAL EXAM: Vitals:   05/20/16 0806  BP: (!) 162/78  Pulse: (!) 109  Resp: 16  Temp: 98 F (36.7 C)   Body mass index is 27.39 kg/m.   General: Alert, no acute distress HEENT:  Normocephalic, atraumatic, oropharynx patent. Right ear with cerumen. Nose congested. Throat clear.   Eye: [facility] Cardiovascular:  Regular rate and rhythm, no rubs murmurs or gallops.  No Carotid bruits, radial pulse intact. No pedal edema.  Respiratory: Clear to auscultation bilaterally.  No wheezes or rales.  No cyanosis, no use of accessory musculature rhonchi in left base.  Abdominal: No organomegaly, abdomen is soft and non-tender, positive bowel sounds.  No masses. Musculoskeletal: Gait intact. No edema, tenderness Skin: No rashes. Neurologic: Facial musculature symmetric. Psychiatric: Patient acts appropriately throughout our interaction. Lymphatic: No cervical or submandibular lymphadenopathy  LABS: Results for orders placed or performed in visit on 05/20/16  POCT CBC  Result Value Ref Range   WBC 9.5 4.6 - 10.2 K/uL   Lymph, poc 3.3 0.6 - 3.4   POC LYMPH PERCENT 34.4 10 - 50 %L   MID (cbc) 0.6 0 - 0.9   POC MID % 6.8 0 - 12 %M   POC Granulocyte 5.6 2 - 6.9   Granulocyte percent 58.8 37 - 80 %G   RBC 4.91 4.04 - 5.48 M/uL   Hemoglobin 14.9 12.2 - 16.2 g/dL   HCT, POC 43.2 37.7 - 47.9 %   MCV 88.1 80 - 97 fL   MCH, POC 30.4 27 - 31.2 pg   MCHC 34.5 31.8 - 35.4 g/dL   RDW, POC  13.0 %   Platelet Count, POC 270 142 - 424 K/uL   MPV 7.1 0 - 99.8 fL      EKG/XRAY:   Primary read interpreted by Dr. Everlene Farrier at [facility]. Dg Chest 2 View  Result Date: 05/20/2016 CLINICAL DATA:  Chest tightness.  Left posterior pain. EXAM: CHEST  2 VIEW COMPARISON:  None. FINDINGS: Left lateral chest is incompletely visualized. Lungs are clear. Heart size is normal. The thoracic aorta is tortuous. Atherosclerotic calcifications at  the aortic arch. Multilevel degenerative facet disease in the visualized cervical spine. Degenerative endplate changes in the thoracic spine. No large pleural effusions. Negative for a pneumothorax. IMPRESSION: No active cardiopulmonary disease. Aortic atherosclerosis. Electronically Signed   By: Markus Daft M.D.   On: 05/20/2016 09:08  .  ASSESSMENT/PLAN: Patient placed on amoxicillin twice a day. She was also given Flonase to use once a day. Tessalon Perles for cough. She is encouraged to drink good amounts of fluids.I personally performed the services described in this documentation, which was scribed in my presence. The recorded information has been reviewed and is accurate.   Gross sideeffects, risk and benefits, and alternatives of medications d/w patient. Patient is aware that all medications have potential sideeffects and we are unable to predict every sideeffect or drug-drug interaction that may occur.  Arlyss Queen MD 05/20/2016 8:16 AM

## 2016-05-20 NOTE — Patient Instructions (Addendum)
Please take medication as prescribed. Return to clinic if not better in 48-72 hours.    IF you received an x-ray today, you will receive an invoice from [facility]. Please contact [facility] at [phone] with questions or concerns regarding your invoice.   IF you received labwork today, you will receive an invoice from [employer]. Please contact [facility] at [phone] with questions or concerns regarding your invoice.   Our billing staff will not be able to assist you with questions regarding bills from these companies.  You will be contacted with the lab results as soon as they are available. The fastest way to get your results is to activate your My Chart account. Instructions are located on the last page of this paperwork. If you have not heard from [place] regarding the results in 2 weeks, please contact this office.     aller

## 2016-06-26 DIAGNOSIS — E782 Mixed hyperlipidemia: Secondary | ICD-10-CM | POA: Diagnosis not present

## 2016-06-26 DIAGNOSIS — J0101 Acute recurrent maxillary sinusitis: Secondary | ICD-10-CM | POA: Diagnosis not present

## 2016-06-26 DIAGNOSIS — M797 Fibromyalgia: Secondary | ICD-10-CM | POA: Diagnosis not present

## 2016-06-26 DIAGNOSIS — I10 Essential (primary) hypertension: Secondary | ICD-10-CM | POA: Diagnosis not present

## 2016-06-26 DIAGNOSIS — E1165 Type 2 diabetes mellitus with hyperglycemia: Secondary | ICD-10-CM | POA: Diagnosis not present

## 2016-06-26 DIAGNOSIS — F419 Anxiety disorder, unspecified: Secondary | ICD-10-CM | POA: Diagnosis not present

## 2016-08-17 DIAGNOSIS — N814 Uterovaginal prolapse, unspecified: Secondary | ICD-10-CM | POA: Diagnosis not present

## 2016-10-11 DIAGNOSIS — E782 Mixed hyperlipidemia: Secondary | ICD-10-CM | POA: Diagnosis not present

## 2016-10-11 DIAGNOSIS — J0100 Acute maxillary sinusitis, unspecified: Secondary | ICD-10-CM | POA: Diagnosis not present

## 2016-10-11 DIAGNOSIS — E1165 Type 2 diabetes mellitus with hyperglycemia: Secondary | ICD-10-CM | POA: Diagnosis not present

## 2016-10-11 DIAGNOSIS — I10 Essential (primary) hypertension: Secondary | ICD-10-CM | POA: Diagnosis not present

## 2016-12-29 DIAGNOSIS — I10 Essential (primary) hypertension: Secondary | ICD-10-CM | POA: Diagnosis not present

## 2016-12-29 DIAGNOSIS — E1142 Type 2 diabetes mellitus with diabetic polyneuropathy: Secondary | ICD-10-CM | POA: Diagnosis not present

## 2016-12-29 DIAGNOSIS — E1165 Type 2 diabetes mellitus with hyperglycemia: Secondary | ICD-10-CM | POA: Diagnosis not present

## 2016-12-29 DIAGNOSIS — J0101 Acute recurrent maxillary sinusitis: Secondary | ICD-10-CM | POA: Diagnosis not present

## 2017-02-16 DIAGNOSIS — R300 Dysuria: Secondary | ICD-10-CM | POA: Diagnosis not present

## 2017-02-16 DIAGNOSIS — A499 Bacterial infection, unspecified: Secondary | ICD-10-CM | POA: Diagnosis not present

## 2017-02-16 DIAGNOSIS — M6208 Separation of muscle (nontraumatic), other site: Secondary | ICD-10-CM | POA: Diagnosis not present

## 2017-02-16 DIAGNOSIS — N390 Urinary tract infection, site not specified: Secondary | ICD-10-CM | POA: Diagnosis not present

## 2017-03-08 DIAGNOSIS — Z01411 Encounter for gynecological examination (general) (routine) with abnormal findings: Secondary | ICD-10-CM | POA: Diagnosis not present

## 2017-03-08 DIAGNOSIS — N762 Acute vulvitis: Secondary | ICD-10-CM | POA: Diagnosis not present

## 2017-03-08 DIAGNOSIS — R300 Dysuria: Secondary | ICD-10-CM | POA: Diagnosis not present

## 2017-03-08 DIAGNOSIS — Z6827 Body mass index (BMI) 27.0-27.9, adult: Secondary | ICD-10-CM | POA: Diagnosis not present

## 2017-03-08 DIAGNOSIS — Z1231 Encounter for screening mammogram for malignant neoplasm of breast: Secondary | ICD-10-CM | POA: Diagnosis not present

## 2017-03-08 DIAGNOSIS — R350 Frequency of micturition: Secondary | ICD-10-CM | POA: Diagnosis not present

## 2017-03-09 ENCOUNTER — Other Ambulatory Visit: Payer: Self-pay | Admitting: Obstetrics & Gynecology

## 2017-03-09 DIAGNOSIS — R928 Other abnormal and inconclusive findings on diagnostic imaging of breast: Secondary | ICD-10-CM

## 2017-03-16 ENCOUNTER — Ambulatory Visit
Admission: RE | Admit: 2017-03-16 | Discharge: 2017-03-16 | Disposition: A | Discharge status: Home / Self Care | Payer: Medicare Other | Source: Ambulatory Visit | Attending: Obstetrics & Gynecology | Admitting: Obstetrics & Gynecology

## 2017-03-16 ENCOUNTER — Other Ambulatory Visit: Payer: Self-pay | Admitting: Obstetrics & Gynecology

## 2017-03-16 DIAGNOSIS — R928 Other abnormal and inconclusive findings on diagnostic imaging of breast: Secondary | ICD-10-CM

## 2017-03-16 DIAGNOSIS — N651 Disproportion of reconstructed breast: Secondary | ICD-10-CM | POA: Diagnosis not present

## 2017-03-16 DIAGNOSIS — N6310 Unspecified lump in the right breast, unspecified quadrant: Secondary | ICD-10-CM

## 2017-03-20 ENCOUNTER — Other Ambulatory Visit: Payer: Self-pay | Admitting: Obstetrics & Gynecology

## 2017-03-20 DIAGNOSIS — N6310 Unspecified lump in the right breast, unspecified quadrant: Secondary | ICD-10-CM

## 2017-03-21 ENCOUNTER — Ambulatory Visit
Admission: RE | Admit: 2017-03-21 | Discharge: 2017-03-21 | Disposition: A | Discharge status: Home / Self Care | Payer: Medicare Other | Source: Ambulatory Visit | Attending: Obstetrics & Gynecology | Admitting: Obstetrics & Gynecology

## 2017-03-21 DIAGNOSIS — C50411 Malignant neoplasm of upper-outer quadrant of right female breast: Secondary | ICD-10-CM | POA: Diagnosis not present

## 2017-03-21 DIAGNOSIS — N6311 Unspecified lump in the right breast, upper outer quadrant: Secondary | ICD-10-CM | POA: Diagnosis not present

## 2017-03-21 DIAGNOSIS — N6310 Unspecified lump in the right breast, unspecified quadrant: Secondary | ICD-10-CM

## 2017-03-28 ENCOUNTER — Other Ambulatory Visit: Payer: Self-pay | Admitting: Surgery

## 2017-03-28 DIAGNOSIS — Z170 Estrogen receptor positive status [ER+]: Principal | ICD-10-CM

## 2017-03-28 DIAGNOSIS — C50911 Malignant neoplasm of unspecified site of right female breast: Secondary | ICD-10-CM | POA: Diagnosis not present

## 2017-03-29 ENCOUNTER — Encounter (HOSPITAL_BASED_OUTPATIENT_CLINIC_OR_DEPARTMENT_OTHER): Payer: Self-pay | Admitting: *Deleted

## 2017-03-30 DIAGNOSIS — Z23 Encounter for immunization: Secondary | ICD-10-CM | POA: Diagnosis not present

## 2017-04-03 ENCOUNTER — Ambulatory Visit
Admission: RE | Admit: 2017-04-03 | Discharge: 2017-04-03 | Disposition: A | Discharge status: Home / Self Care | Payer: Medicare Other | Source: Ambulatory Visit | Attending: Surgery | Admitting: Surgery

## 2017-04-03 ENCOUNTER — Encounter (HOSPITAL_BASED_OUTPATIENT_CLINIC_OR_DEPARTMENT_OTHER)
Admission: RE | Admit: 2017-04-03 | Discharge: 2017-04-03 | Disposition: A | Discharge status: Home / Self Care | Payer: Medicare Other | Source: Ambulatory Visit | Attending: Surgery | Admitting: Surgery

## 2017-04-03 ENCOUNTER — Other Ambulatory Visit: Payer: Self-pay

## 2017-04-03 ENCOUNTER — Ambulatory Visit: Admission: RE | Admit: 2017-04-03 | Payer: Medicare Other | Source: Ambulatory Visit

## 2017-04-03 ENCOUNTER — Other Ambulatory Visit: Payer: Self-pay | Admitting: Surgery

## 2017-04-03 DIAGNOSIS — C50911 Malignant neoplasm of unspecified site of right female breast: Secondary | ICD-10-CM

## 2017-04-03 DIAGNOSIS — Z170 Estrogen receptor positive status [ER+]: Secondary | ICD-10-CM | POA: Diagnosis not present

## 2017-04-03 DIAGNOSIS — I6930 Unspecified sequelae of cerebral infarction: Secondary | ICD-10-CM | POA: Diagnosis not present

## 2017-04-03 DIAGNOSIS — N6310 Unspecified lump in the right breast, unspecified quadrant: Secondary | ICD-10-CM | POA: Diagnosis not present

## 2017-04-03 DIAGNOSIS — M797 Fibromyalgia: Secondary | ICD-10-CM | POA: Diagnosis not present

## 2017-04-03 DIAGNOSIS — R928 Other abnormal and inconclusive findings on diagnostic imaging of breast: Secondary | ICD-10-CM | POA: Diagnosis not present

## 2017-04-03 DIAGNOSIS — Z885 Allergy status to narcotic agent status: Secondary | ICD-10-CM | POA: Diagnosis not present

## 2017-04-03 DIAGNOSIS — I10 Essential (primary) hypertension: Secondary | ICD-10-CM | POA: Diagnosis not present

## 2017-04-03 DIAGNOSIS — R000 Tachycardia, unspecified: Secondary | ICD-10-CM | POA: Diagnosis not present

## 2017-04-03 DIAGNOSIS — M1990 Unspecified osteoarthritis, unspecified site: Secondary | ICD-10-CM | POA: Diagnosis not present

## 2017-04-03 DIAGNOSIS — F419 Anxiety disorder, unspecified: Secondary | ICD-10-CM | POA: Diagnosis not present

## 2017-04-03 DIAGNOSIS — Z7982 Long term (current) use of aspirin: Secondary | ICD-10-CM | POA: Diagnosis not present

## 2017-04-03 DIAGNOSIS — Z7984 Long term (current) use of oral hypoglycemic drugs: Secondary | ICD-10-CM | POA: Diagnosis not present

## 2017-04-03 DIAGNOSIS — E7800 Pure hypercholesterolemia, unspecified: Secondary | ICD-10-CM | POA: Diagnosis not present

## 2017-04-03 DIAGNOSIS — E119 Type 2 diabetes mellitus without complications: Secondary | ICD-10-CM | POA: Diagnosis not present

## 2017-04-03 DIAGNOSIS — Z881 Allergy status to other antibiotic agents status: Secondary | ICD-10-CM | POA: Diagnosis not present

## 2017-04-03 DIAGNOSIS — Z888 Allergy status to other drugs, medicaments and biological substances status: Secondary | ICD-10-CM | POA: Diagnosis not present

## 2017-04-03 DIAGNOSIS — Z79899 Other long term (current) drug therapy: Secondary | ICD-10-CM | POA: Diagnosis not present

## 2017-04-03 DIAGNOSIS — K219 Gastro-esophageal reflux disease without esophagitis: Secondary | ICD-10-CM | POA: Diagnosis not present

## 2017-04-03 DIAGNOSIS — F329 Major depressive disorder, single episode, unspecified: Secondary | ICD-10-CM | POA: Diagnosis not present

## 2017-04-03 LAB — BASIC METABOLIC PANEL
Anion gap: 10 (ref 5–15)
BUN: 13 mg/dL (ref 6–20)
CO2: 25 mmol/L (ref 22–32)
Calcium: 9.6 mg/dL (ref 8.9–10.3)
Chloride: 101 mmol/L (ref 101–111)
Creatinine, Ser: 0.66 mg/dL (ref 0.44–1.00)
Glucose, Bld: 208 mg/dL — ABNORMAL HIGH (ref 65–99)
Potassium: 4.4 mmol/L (ref 3.5–5.1)
SODIUM: 136 mmol/L (ref 135–145)

## 2017-04-03 NOTE — Progress Notes (Signed)
Pt in for PAT appt. Ensure pre op drink given and instructions reviewed.

## 2017-04-03 NOTE — H&P (Signed)
Christina [name] 03/28/2017 3:02 PM Location: [facility] Patient #: [id number] DOB: 1940-10-29 Widowed / Language: Christina [name] / Race: White Female   History of Present Illness (Christina [name] [name] MD; 03/28/2017 3:29 PM) The patient is a 76 year old female who presents with breast cancer. This is a pleasant patient referred by the breast center after the recent diagnosis of a right breast cancer. She had a small mass found on screening mammography. It measured 7 mm. She underwent a stereotactic biopsy showing an invasive ductal carcinoma. It was ER and PR positive. She has had a previous benign biopsy approximately 30 years ago from the right breast. She has no other history of breast cancer or breast problems. She has had a previous CVA. She denies nipple discharge. She denies breast pain. She is otherwise without complaints.   Past Surgical History (Christina [name], [place]; 03/28/2017 3:02 PM) Breast Biopsy  Right. Cataract Surgery  Bilateral. Oral Surgery  Tonsillectomy   Diagnostic Studies History (Christina [name], [name]; 03/28/2017 3:02 PM) Colonoscopy  never Mammogram  within last year Pap Smear  >5 years ago  Allergies (Christina [name], [place]; 03/28/2017 3:04 PM) Codeine Sulfate *ANALGESICS - OPIOID*  Hives.  Medication History (Christina [name], [name]; 03/28/2017 3:04 PM) ALPRAZolam (0.5MG  Tablet, Oral) Active. TraMADol HCl (50MG  Tablet, Oral) Active. Cephalexin (500MG  Capsule, Oral) Active. HydroCHLOROthiazide (12.5MG  Tablet, Oral) Active. Irbesartan (300MG  Tablet, Oral) Active. Glimepiride (2MG  Tablet, Oral) Active. MetFORMIN HCl ER (500MG  Tablet ER 24HR, Oral) Active. Aspirin (81MG  Tablet, Oral) Active. Medications Reconciled  Social History (Christina [name], [place]; 03/28/2017 3:02 PM) Caffeine use  Carbonated beverages, Coffee, Tea. No alcohol use  No drug use  Tobacco use  Never smoker.  Family History (Christina [name], [name];  03/28/2017 3:02 PM) Alcohol Abuse  Father. Anesthetic complications  Son. Arthritis  Mother. Cancer  Sister. Depression  Mother, Son. Heart Disease  Mother. Hypertension  Daughter, Mother. Melanoma  Sister. Respiratory Condition  Father. Thyroid problems  Mother.  Pregnancy / Birth History (Christina [name], [name]; 03/28/2017 3:02 PM) Age at menarche  86 years. Age of menopause  <44 Gravida  4 Maternal age  43-20 Para  4  Other Problems (Christina [name], [name]; 03/28/2017 3:02 PM) Anxiety Disorder  Arthritis  Back Pain  Bladder Problems  Breast Cancer  Cerebrovascular Accident  Depression  Diabetes Mellitus  Gastroesophageal Reflux Disease  Hemorrhoids  High blood pressure  Hypercholesterolemia  Inguinal Hernia     Review of Systems (Christina A. [name] RMA; 03/28/2017 3:02 PM) General Not Present- Appetite Loss, Chills, Fatigue, Fever, Night Sweats, Weight Gain and Weight Loss. Skin Not Present- Change in Wart/Mole, Dryness, Hives, Jaundice, New Lesions, Non-Healing Wounds, Rash and Ulcer. HEENT Present- Seasonal Allergies, Sinus Pain and Wears glasses/contact lenses. Not Present- Earache, Hearing Loss, Hoarseness, Nose Bleed, Oral Ulcers, Ringing in the Ears, Sore Throat, Visual Disturbances and Yellow Eyes. Respiratory Present- Snoring. Not Present- Bloody sputum, Chronic Cough, Difficulty Breathing and Wheezing. Breast Not Present- Breast Mass, Breast Pain, Nipple Discharge and Skin Changes. Cardiovascular Not Present- Chest Pain, Difficulty Breathing Lying Down, Leg Cramps, Palpitations, Rapid Heart Rate, Shortness of Breath and Swelling of Extremities. Gastrointestinal Present- Bloating, Difficulty Swallowing, Excessive gas and Hemorrhoids. Not Present- Abdominal Pain, Bloody Stool, Change in Bowel Habits, Chronic diarrhea, Constipation, Gets full quickly at meals, Indigestion, Nausea, Rectal Pain and Vomiting. Female Genitourinary Present-  Nocturia. Not Present- Frequency, Painful Urination, Pelvic Pain and Urgency. Musculoskeletal Present- Back Pain, Joint Pain and Muscle  Pain. Not Present- Joint Stiffness, Muscle Weakness and Swelling of Extremities. Neurological Present- Decreased Memory, Numbness and Tingling. Not Present- Fainting, Headaches, Seizures, Tremor, Trouble walking and Weakness. Psychiatric Present- Anxiety, Depression and Fearful. Not Present- Bipolar, Change in Sleep Pattern and Frequent crying. Endocrine Present- New Diabetes. Not Present- Cold Intolerance, Excessive Hunger, Hair Changes, Heat Intolerance and Hot flashes.  Vitals (Christina A. [name] RMA; 03/28/2017 3:03 PM) 03/28/2017 3:02 PM Weight: 154.8 lb Height: 62in Body Surface Area: 1.71 m Body Mass Index: 28.31 kg/m  Temp.: 98.35F  Pulse: 117 (Regular)  BP: 132/82 (Sitting, Left Arm, Standard)       Physical Exam (Jamoni Hewes [name] MD; 03/28/2017 3:29 PM) General Mental Status-Alert. General Appearance-Consistent with stated age. Hydration-Well hydrated. Voice-Normal.  Head and Neck Head-normocephalic, atraumatic with no lesions or palpable masses. Trachea-midline. Thyroid Gland Characteristics - normal size and consistency.  Eye Eyeball - Bilateral-Extraocular movements intact. Sclera/Conjunctiva - Bilateral-No scleral icterus.  Chest and Lung Exam Chest and lung exam reveals -quiet, even and easy respiratory effort with no use of accessory muscles and on auscultation, normal breath sounds, no adventitious sounds and normal vocal resonance. Inspection Chest Wall - Normal. Back - normal.  Breast Breast - Left-Symmetric, Non Tender, No Biopsy scars, no Dimpling, No Inflammation, No Lumpectomy scars, No Mastectomy scars, No Peau d' Orange. Breast - Right-Symmetric, Non Tender, No Biopsy scars, no Dimpling, No Inflammation, No Lumpectomy scars, No Mastectomy scars, No Peau d' Orange. Breast  Lump-No Palpable Breast Mass. Note: There is some ecchymosis of the right breast from the previous biopsy   Cardiovascular Cardiovascular examination reveals -normal heart sounds, regular rate and rhythm with no murmurs and normal pedal pulses bilaterally.  Abdomen Inspection Inspection of the abdomen reveals - No Hernias. Skin - Scar - no surgical scars. Palpation/Percussion Palpation and Percussion of the abdomen reveal - Soft, Non Tender, No Rebound tenderness, No Rigidity (guarding) and No hepatosplenomegaly. Auscultation Auscultation of the abdomen reveals - Bowel sounds normal.  Neurologic - Did not examine.  Musculoskeletal - Did not examine.  Lymphatic Head & Neck  General Head & Neck Lymphatics: Bilateral - Description - Normal. Axillary  General Axillary Region: Bilateral - Description - Normal. Tenderness - Non Tender. Femoral & Inguinal  Generalized Femoral & Inguinal Lymphatics: Bilateral - Description - Normal. Tenderness - Non Tender.    Assessment & Plan (Lorraine Terriquez [name] MD; 03/28/2017 3:30 PM) BREAST CANCER, STAGE 1, RIGHT (C50.911) Impression: I had a long discussion with the patient and her family regarding the diagnosis. We discussed the pathologic findings in detail and I gave him a copy of the pathology report. We discussed breast cancer in detail. We discussed both breast conservation as well as mastectomy. We discussed the benefits of both. After discussion, she wishes to proceed with a right breast partial mastectomy with sentinel lymph node biopsy. We discussed the risk of surgery which includes but is not limited to bleeding, infection, injury to surrounding structures, the need for further surgery if margins or lymph nodes are positive, seroma formation, cardiopulmonary issues, postoperative recovery, etc. She will be referred to both medical and radiation oncology as well. She understands and agrees to proceed with surgery which will be  scheduled

## 2017-04-04 ENCOUNTER — Encounter (HOSPITAL_BASED_OUTPATIENT_CLINIC_OR_DEPARTMENT_OTHER): Payer: Self-pay | Admitting: Certified Registered"

## 2017-04-04 ENCOUNTER — Ambulatory Visit (HOSPITAL_BASED_OUTPATIENT_CLINIC_OR_DEPARTMENT_OTHER): Payer: Medicare Other | Admitting: Certified Registered"

## 2017-04-04 ENCOUNTER — Encounter (HOSPITAL_BASED_OUTPATIENT_CLINIC_OR_DEPARTMENT_OTHER)
Admission: RE | Disposition: A | Discharge status: Home / Self Care | Payer: Self-pay | Source: Ambulatory Visit | Attending: Surgery

## 2017-04-04 ENCOUNTER — Ambulatory Visit
Admission: RE | Admit: 2017-04-04 | Discharge: 2017-04-04 | Disposition: A | Discharge status: Home / Self Care | Payer: Medicare Other | Source: Ambulatory Visit | Attending: Surgery | Admitting: Surgery

## 2017-04-04 ENCOUNTER — Ambulatory Visit (HOSPITAL_BASED_OUTPATIENT_CLINIC_OR_DEPARTMENT_OTHER)
Admission: RE | Admit: 2017-04-04 | Discharge: 2017-04-05 | Disposition: A | Discharge status: Home / Self Care | Payer: Medicare Other | Source: Ambulatory Visit | Attending: Surgery | Admitting: Surgery

## 2017-04-04 ENCOUNTER — Ambulatory Visit (HOSPITAL_COMMUNITY)
Admission: RE | Admit: 2017-04-04 | Discharge: 2017-04-04 | Disposition: A | Discharge status: Home / Self Care | Payer: Medicare Other | Source: Ambulatory Visit | Attending: Surgery | Admitting: Surgery

## 2017-04-04 DIAGNOSIS — F419 Anxiety disorder, unspecified: Secondary | ICD-10-CM | POA: Insufficient documentation

## 2017-04-04 DIAGNOSIS — C50411 Malignant neoplasm of upper-outer quadrant of right female breast: Secondary | ICD-10-CM | POA: Diagnosis present

## 2017-04-04 DIAGNOSIS — E119 Type 2 diabetes mellitus without complications: Secondary | ICD-10-CM | POA: Diagnosis not present

## 2017-04-04 DIAGNOSIS — M1990 Unspecified osteoarthritis, unspecified site: Secondary | ICD-10-CM | POA: Diagnosis not present

## 2017-04-04 DIAGNOSIS — K219 Gastro-esophageal reflux disease without esophagitis: Secondary | ICD-10-CM | POA: Insufficient documentation

## 2017-04-04 DIAGNOSIS — Z170 Estrogen receptor positive status [ER+]: Principal | ICD-10-CM

## 2017-04-04 DIAGNOSIS — F329 Major depressive disorder, single episode, unspecified: Secondary | ICD-10-CM | POA: Diagnosis not present

## 2017-04-04 DIAGNOSIS — Z881 Allergy status to other antibiotic agents status: Secondary | ICD-10-CM | POA: Insufficient documentation

## 2017-04-04 DIAGNOSIS — E7800 Pure hypercholesterolemia, unspecified: Secondary | ICD-10-CM | POA: Diagnosis not present

## 2017-04-04 DIAGNOSIS — R928 Other abnormal and inconclusive findings on diagnostic imaging of breast: Secondary | ICD-10-CM | POA: Diagnosis not present

## 2017-04-04 DIAGNOSIS — Z79899 Other long term (current) drug therapy: Secondary | ICD-10-CM | POA: Insufficient documentation

## 2017-04-04 DIAGNOSIS — C50911 Malignant neoplasm of unspecified site of right female breast: Secondary | ICD-10-CM

## 2017-04-04 DIAGNOSIS — G8918 Other acute postprocedural pain: Secondary | ICD-10-CM | POA: Diagnosis not present

## 2017-04-04 DIAGNOSIS — M797 Fibromyalgia: Secondary | ICD-10-CM | POA: Insufficient documentation

## 2017-04-04 DIAGNOSIS — N6489 Other specified disorders of breast: Secondary | ICD-10-CM | POA: Diagnosis not present

## 2017-04-04 DIAGNOSIS — Z7984 Long term (current) use of oral hypoglycemic drugs: Secondary | ICD-10-CM | POA: Diagnosis not present

## 2017-04-04 DIAGNOSIS — Z885 Allergy status to narcotic agent status: Secondary | ICD-10-CM | POA: Insufficient documentation

## 2017-04-04 DIAGNOSIS — I10 Essential (primary) hypertension: Secondary | ICD-10-CM | POA: Insufficient documentation

## 2017-04-04 DIAGNOSIS — I6930 Unspecified sequelae of cerebral infarction: Secondary | ICD-10-CM | POA: Diagnosis not present

## 2017-04-04 DIAGNOSIS — R000 Tachycardia, unspecified: Secondary | ICD-10-CM | POA: Diagnosis not present

## 2017-04-04 DIAGNOSIS — Z7982 Long term (current) use of aspirin: Secondary | ICD-10-CM | POA: Insufficient documentation

## 2017-04-04 DIAGNOSIS — C50011 Malignant neoplasm of nipple and areola, right female breast: Secondary | ICD-10-CM

## 2017-04-04 DIAGNOSIS — Z888 Allergy status to other drugs, medicaments and biological substances status: Secondary | ICD-10-CM | POA: Insufficient documentation

## 2017-04-04 HISTORY — DX: Gastro-esophageal reflux disease without esophagitis: K21.9

## 2017-04-04 HISTORY — PX: BREAST LUMPECTOMY WITH RADIOACTIVE SEED AND SENTINEL LYMPH NODE BIOPSY: SHX6550

## 2017-04-04 LAB — GLUCOSE, CAPILLARY
GLUCOSE-CAPILLARY: 288 mg/dL — AB (ref 65–99)
GLUCOSE-CAPILLARY: 276 mg/dL — AB (ref 65–99)
Glucose-Capillary: 191 mg/dL — ABNORMAL HIGH (ref 65–99)

## 2017-04-04 SURGERY — BREAST LUMPECTOMY WITH RADIOACTIVE SEED AND SENTINEL LYMPH NODE BIOPSY
Anesthesia: General | Site: Breast | Laterality: Right

## 2017-04-04 MED ORDER — DEXAMETHASONE SODIUM PHOSPHATE 4 MG/ML IJ SOLN
INTRAMUSCULAR | Status: DC | PRN
  Administered 2017-04-04: 10 mg via INTRAVENOUS

## 2017-04-04 MED ORDER — PHENYLEPHRINE 40 MCG/ML (10ML) SYRINGE FOR IV PUSH (FOR BLOOD PRESSURE SUPPORT)
PREFILLED_SYRINGE | INTRAVENOUS | Status: AC
  Filled 2017-04-04: qty 40

## 2017-04-04 MED ORDER — FLUTICASONE PROPIONATE 50 MCG/ACT NA SUSP: 2.0000 | NASAL | Status: DC | PRN

## 2017-04-04 MED ORDER — CEFAZOLIN SODIUM-DEXTROSE 2-4 GM/100ML-% IV SOLN
INTRAVENOUS | Status: AC
  Filled 2017-04-04: qty 100

## 2017-04-04 MED ORDER — MELOXICAM 15 MG PO TABS: 15.0000 mg | ORAL_TABLET | Freq: Every day | ORAL | Status: DC

## 2017-04-04 MED ORDER — ALPRAZOLAM 0.5 MG PO TABS: 0.5000 mg | ORAL_TABLET | Freq: Three times a day (TID) | ORAL | Status: DC | PRN

## 2017-04-04 MED ORDER — ONDANSETRON HCL 4 MG/2ML IJ SOLN
INTRAMUSCULAR | Status: AC
  Filled 2017-04-04: qty 12

## 2017-04-04 MED ORDER — SCOPOLAMINE 1 MG/3DAYS TD PT72: 1.0000 | MEDICATED_PATCH | Freq: Once | TRANSDERMAL | Status: DC | PRN

## 2017-04-04 MED ORDER — CHLORHEXIDINE GLUCONATE CLOTH 2 % EX PADS: 6.0000 | MEDICATED_PAD | Freq: Once | CUTANEOUS | Status: DC

## 2017-04-04 MED ORDER — FENTANYL CITRATE (PF) 100 MCG/2ML IJ SOLN: 25.0000 ug | INTRAMUSCULAR | Status: DC | PRN

## 2017-04-04 MED ORDER — LACTATED RINGERS IV SOLN: 500.0000 mL | INTRAVENOUS | Status: DC

## 2017-04-04 MED ORDER — LIDOCAINE 2% (20 MG/ML) 5 ML SYRINGE
INTRAMUSCULAR | Status: AC
  Filled 2017-04-04: qty 10

## 2017-04-04 MED ORDER — FENTANYL CITRATE (PF) 100 MCG/2ML IJ SOLN
INTRAMUSCULAR | Status: AC
  Filled 2017-04-04: qty 2

## 2017-04-04 MED ORDER — ACETAMINOPHEN 500 MG PO TABS
1000.0000 mg | ORAL_TABLET | ORAL | Status: AC
  Administered 2017-04-04: 1000 mg via ORAL

## 2017-04-04 MED ORDER — MIDAZOLAM HCL 2 MG/2ML IJ SOLN
INTRAMUSCULAR | Status: AC
  Filled 2017-04-04: qty 2

## 2017-04-04 MED ORDER — GLIMEPIRIDE 2 MG PO TABS: 2.0000 mg | ORAL_TABLET | Freq: Every day | ORAL | Status: DC

## 2017-04-04 MED ORDER — PROPOFOL 10 MG/ML IV BOLUS
INTRAVENOUS | Status: DC | PRN
  Administered 2017-04-04: 150 mg via INTRAVENOUS

## 2017-04-04 MED ORDER — ONDANSETRON HCL 4 MG/2ML IJ SOLN: 4.0000 mg | Freq: Once | INTRAMUSCULAR | Status: DC | PRN

## 2017-04-04 MED ORDER — SODIUM CHLORIDE 0.9 % IV SOLN
INTRAVENOUS | Status: DC
  Administered 2017-04-04: 13:00:00 via INTRAVENOUS

## 2017-04-04 MED ORDER — GABAPENTIN 300 MG PO CAPS
ORAL_CAPSULE | ORAL | Status: AC
  Filled 2017-04-04: qty 1

## 2017-04-04 MED ORDER — NABUMETONE 500 MG PO TABS: 500.0000 mg | ORAL_TABLET | Freq: Two times a day (BID) | ORAL | Status: DC | PRN

## 2017-04-04 MED ORDER — INSULIN ASPART 100 UNIT/ML ~~LOC~~ SOLN: 0.0000 [IU] | Freq: Three times a day (TID) | SUBCUTANEOUS | Status: DC

## 2017-04-04 MED ORDER — BUPIVACAINE-EPINEPHRINE 0.5% -1:200000 IJ SOLN
INTRAMUSCULAR | Status: DC | PRN
  Administered 2017-04-04: 10 mL

## 2017-04-04 MED ORDER — DEXAMETHASONE SODIUM PHOSPHATE 10 MG/ML IJ SOLN
INTRAMUSCULAR | Status: AC
  Filled 2017-04-04: qty 2

## 2017-04-04 MED ORDER — ONDANSETRON HCL 4 MG/2ML IJ SOLN
INTRAMUSCULAR | Status: DC | PRN
  Administered 2017-04-04: 4 mg via INTRAVENOUS

## 2017-04-04 MED ORDER — DIPHENHYDRAMINE HCL 12.5 MG/5ML PO ELIX: 12.5000 mg | ORAL_SOLUTION | Freq: Four times a day (QID) | ORAL | Status: DC | PRN

## 2017-04-04 MED ORDER — PROPOFOL 500 MG/50ML IV EMUL
INTRAVENOUS | Status: AC
  Filled 2017-04-04: qty 50

## 2017-04-04 MED ORDER — DIPHENHYDRAMINE HCL 50 MG/ML IJ SOLN: 12.5000 mg | Freq: Four times a day (QID) | INTRAMUSCULAR | Status: DC | PRN

## 2017-04-04 MED ORDER — EPHEDRINE SULFATE 50 MG/ML IJ SOLN
INTRAMUSCULAR | Status: DC | PRN
  Administered 2017-04-04: 10 mg via INTRAVENOUS

## 2017-04-04 MED ORDER — LACTATED RINGERS IV SOLN
INTRAVENOUS | Status: DC
  Administered 2017-04-04 (×2): via INTRAVENOUS

## 2017-04-04 MED ORDER — OXYCODONE HCL 5 MG PO TABS: 5.0000 mg | ORAL_TABLET | Freq: Four times a day (QID) | ORAL | Status: DC | PRN

## 2017-04-04 MED ORDER — TRAMADOL HCL 50 MG PO TABS
50.0000 mg | ORAL_TABLET | Freq: Four times a day (QID) | ORAL | Status: DC | PRN
  Administered 2017-04-05: 50 mg via ORAL
  Filled 2017-04-04: qty 1

## 2017-04-04 MED ORDER — EPHEDRINE 5 MG/ML INJ
INTRAVENOUS | Status: AC
  Filled 2017-04-04: qty 20

## 2017-04-04 MED ORDER — BUPIVACAINE-EPINEPHRINE (PF) 0.5% -1:200000 IJ SOLN
INTRAMUSCULAR | Status: DC | PRN
Start: 2017-04-04 — End: 2017-04-04
  Administered 2017-04-04: 30 mL via PERINEURAL

## 2017-04-04 MED ORDER — MIDAZOLAM HCL 2 MG/2ML IJ SOLN: 1.0000 mg | INTRAMUSCULAR | Status: DC | PRN

## 2017-04-04 MED ORDER — GABAPENTIN 300 MG PO CAPS
300.0000 mg | ORAL_CAPSULE | ORAL | Status: AC
  Administered 2017-04-04: 300 mg via ORAL

## 2017-04-04 MED ORDER — ACETAMINOPHEN 500 MG PO TABS
ORAL_TABLET | ORAL | Status: AC
  Filled 2017-04-04: qty 2

## 2017-04-04 MED ORDER — METFORMIN HCL ER 500 MG PO TB24
500.0000 mg | ORAL_TABLET | Freq: Every day | ORAL | Status: DC
  Administered 2017-04-04: 500 mg via ORAL

## 2017-04-04 MED ORDER — CEFAZOLIN SODIUM-DEXTROSE 2-4 GM/100ML-% IV SOLN
2.0000 g | INTRAVENOUS | Status: AC
  Administered 2017-04-04: 2 g via INTRAVENOUS

## 2017-04-04 MED ORDER — INSULIN ASPART 100 UNIT/ML ~~LOC~~ SOLN: 0.0000 [IU] | Freq: Every day | SUBCUTANEOUS | Status: DC

## 2017-04-04 MED ORDER — PHENYLEPHRINE HCL 10 MG/ML IJ SOLN
INTRAMUSCULAR | Status: DC | PRN
  Administered 2017-04-04: 80 ug via INTRAVENOUS
  Administered 2017-04-04: 40 ug via INTRAVENOUS

## 2017-04-04 MED ORDER — VALACYCLOVIR HCL 500 MG PO TABS: 1000.0000 mg | ORAL_TABLET | Freq: Two times a day (BID) | ORAL | Status: DC

## 2017-04-04 MED ORDER — GABAPENTIN 300 MG PO CAPS: 300.0000 mg | ORAL_CAPSULE | Freq: Two times a day (BID) | ORAL | Status: DC

## 2017-04-04 MED ORDER — LIDOCAINE HCL (CARDIAC) 20 MG/ML IV SOLN
INTRAVENOUS | Status: DC | PRN
  Administered 2017-04-04: 30 mg via INTRAVENOUS

## 2017-04-04 MED ORDER — ONDANSETRON 4 MG PO TBDP: 4.0000 mg | ORAL_TABLET | Freq: Four times a day (QID) | ORAL | Status: DC | PRN

## 2017-04-04 MED ORDER — ONDANSETRON HCL 4 MG/2ML IJ SOLN: 4.0000 mg | Freq: Four times a day (QID) | INTRAMUSCULAR | Status: DC | PRN

## 2017-04-04 MED ORDER — DILTIAZEM HCL ER COATED BEADS 360 MG PO CP24: 360.0000 mg | ORAL_CAPSULE | Freq: Every day | ORAL | Status: DC

## 2017-04-04 MED ORDER — PRAVASTATIN SODIUM 20 MG PO TABS
20.0000 mg | ORAL_TABLET | Freq: Every day | ORAL | Status: DC
  Administered 2017-04-04: 20 mg via ORAL

## 2017-04-04 MED ORDER — FENTANYL CITRATE (PF) 100 MCG/2ML IJ SOLN
50.0000 ug | INTRAMUSCULAR | Status: DC | PRN
  Administered 2017-04-04 (×2): 50 ug via INTRAVENOUS

## 2017-04-04 SURGICAL SUPPLY — 47 items
ADH SKN CLS APL DERMABOND .7 (GAUZE/BANDAGES/DRESSINGS) ×1
APPLIER CLIP 9.375 MED OPEN (MISCELLANEOUS) ×3
APR CLP MED 9.3 20 MLT OPN (MISCELLANEOUS) ×1
BINDER BREAST LRG (GAUZE/BANDAGES/DRESSINGS) ×2 IMPLANT
BLADE HEX COATED 2.75 (ELECTRODE) ×3 IMPLANT
BLADE SURG 15 STRL LF DISP TIS (BLADE) ×1 IMPLANT
BLADE SURG 15 STRL SS (BLADE) ×3
CANISTER SUCT 1200ML W/VALVE (MISCELLANEOUS) IMPLANT
CHLORAPREP W/TINT 26ML (MISCELLANEOUS) ×3 IMPLANT
CLIP APPLIE 9.375 MED OPEN (MISCELLANEOUS) ×1 IMPLANT
CLIP VESOCCLUDE SM WIDE 6/CT (CLIP) IMPLANT
COVER BACK TABLE 60X90IN (DRAPES) ×3 IMPLANT
COVER MAYO STAND STRL (DRAPES) ×3 IMPLANT
COVER PROBE W GEL 5X96 (DRAPES) ×3 IMPLANT
DECANTER SPIKE VIAL GLASS SM (MISCELLANEOUS) IMPLANT
DERMABOND ADVANCED (GAUZE/BANDAGES/DRESSINGS) ×2
DERMABOND ADVANCED .7 DNX12 (GAUZE/BANDAGES/DRESSINGS) ×1 IMPLANT
DEVICE DUBIN W/COMP PLATE 8390 (MISCELLANEOUS) ×3 IMPLANT
DRAPE LAPAROSCOPIC ABDOMINAL (DRAPES) ×3 IMPLANT
DRAPE UTILITY XL STRL (DRAPES) ×3 IMPLANT
ELECT REM PT RETURN 9FT ADLT (ELECTROSURGICAL) ×3
ELECTRODE REM PT RTRN 9FT ADLT (ELECTROSURGICAL) ×1 IMPLANT
GAUZE SPONGE 4X4 12PLY STRL LF (GAUZE/BANDAGES/DRESSINGS) IMPLANT
GLOVE SURG SIGNA 7.5 PF LTX (GLOVE) ×3 IMPLANT
GOWN STRL REUS W/ TWL LRG LVL3 (GOWN DISPOSABLE) ×1 IMPLANT
GOWN STRL REUS W/ TWL XL LVL3 (GOWN DISPOSABLE) ×1 IMPLANT
GOWN STRL REUS W/TWL LRG LVL3 (GOWN DISPOSABLE) ×3
GOWN STRL REUS W/TWL XL LVL3 (GOWN DISPOSABLE) ×3
KIT MARKER MARGIN INK (KITS) ×3 IMPLANT
NDL SAFETY ECLIPSE 18X1.5 (NEEDLE) ×1 IMPLANT
NEEDLE HYPO 18GX1.5 SHARP (NEEDLE) ×3
NEEDLE HYPO 25X1 1.5 SAFETY (NEEDLE) ×3 IMPLANT
NS IRRIG 1000ML POUR BTL (IV SOLUTION) ×3 IMPLANT
PACK BASIN DAY SURGERY FS (CUSTOM PROCEDURE TRAY) ×3 IMPLANT
PENCIL BUTTON HOLSTER BLD 10FT (ELECTRODE) ×3 IMPLANT
SLEEVE SCD COMPRESS KNEE MED (MISCELLANEOUS) ×3 IMPLANT
SPONGE LAP 4X18 X RAY DECT (DISPOSABLE) ×3 IMPLANT
SUT MNCRL AB 4-0 PS2 18 (SUTURE) ×3 IMPLANT
SUT SILK 2 0 SH (SUTURE) IMPLANT
SUT VIC AB 3-0 SH 27 (SUTURE) ×3
SUT VIC AB 3-0 SH 27X BRD (SUTURE) ×1 IMPLANT
SYR CONTROL 10ML LL (SYRINGE) ×3 IMPLANT
TOWEL OR 17X24 6PK STRL BLUE (TOWEL DISPOSABLE) ×3 IMPLANT
TOWEL OR NON WOVEN STRL DISP B (DISPOSABLE) ×3 IMPLANT
TUBE CONNECTING 20'X1/4 (TUBING)
TUBE CONNECTING 20X1/4 (TUBING) IMPLANT
YANKAUER SUCT BULB TIP NO VENT (SUCTIONS) IMPLANT

## 2017-04-04 NOTE — Transfer of Care (Signed)
Immediate Anesthesia Transfer of Care Note  Patient: Christina [name]  Procedure(s) Performed: RIGHT BREAST PARTIAL MASTECTOMY WITH RADIOACTIVE SEED AND RIGHT SENTINEL LYMPH NODE BIOPSY (Right Breast)  Patient Location: PACU  Anesthesia Type:GA combined with regional for post-op pain  Level of Consciousness: awake and patient cooperative  Airway & Oxygen Therapy: Patient Spontanous Breathing and Patient connected to face mask oxygen  Post-op Assessment: Report given to RN and Post -op Vital signs reviewed and stable  Post vital signs: Reviewed and stable  Last Vitals:  Vitals:   04/04/17 1102 04/04/17 1107  BP: 127/68   Pulse: (!) 119 (!) 121  Resp: 19 18  SpO2: 100% 100%    Last Pain: There were no vitals filed for this visit.       Complications: No apparent anesthesia complications

## 2017-04-04 NOTE — Progress Notes (Signed)
Assisted Dr. Gifford Shave with right, ultrasound guided, pectoralis block. Side rails up, monitors on throughout procedure. See vital signs in flow sheet. Tolerated Procedure well.

## 2017-04-04 NOTE — Anesthesia Procedure Notes (Signed)
Anesthesia Regional Block: Pectoralis block   Pre-Anesthetic Checklist: ,, timeout performed, Correct Patient, Correct Site, Correct Laterality, Correct Procedure, Correct Position, site marked, Risks and benefits discussed,  Surgical consent,  Pre-op evaluation,  At surgeon's request and post-op pain management  Laterality: Right  Prep: chloraprep       Needles:  Injection technique: Single-shot  Needle Type: Echogenic Needle     Needle Length: 9cm  Needle Gauge: 21     Additional Needles:   Procedures:,,,, ultrasound used (permanent image in chart),,,,  Narrative:  Start time: 04/04/2017 10:51 AM End time: 04/04/2017 10:56 AM Injection made incrementally with aspirations every 5 mL.  Performed by: Personally  Anesthesiologist: Catalina Gravel  Additional Notes: No pain on injection. No increased resistance to injection. Injection made in 5cc increments.  Good needle visualization.  Patient tolerated procedure well.

## 2017-04-04 NOTE — Progress Notes (Addendum)
Emotional support given to pt during nuc med injections

## 2017-04-04 NOTE — Anesthesia Postprocedure Evaluation (Signed)
Anesthesia Post Note  Patient: Christina [name]  Procedure(s) Performed: RIGHT BREAST PARTIAL MASTECTOMY WITH RADIOACTIVE SEED AND RIGHT SENTINEL LYMPH NODE BIOPSY (Right Breast)     Patient location during evaluation: PACU Anesthesia Type: General Level of consciousness: awake and alert Pain management: pain level controlled Vital Signs Assessment: post-procedure vital signs reviewed and stable Respiratory status: spontaneous breathing, nonlabored ventilation and respiratory function stable Cardiovascular status: blood pressure returned to baseline and stable Postop Assessment: no apparent nausea or vomiting Anesthetic complications: no    Last Vitals:  Vitals:   04/04/17 1300 04/04/17 1328  BP: 135/75 136/72  Pulse: (!) 109 (!) 104  Resp: 18 18  Temp:  36.7 C  SpO2: 93% 93%    Last Pain:  Vitals:   04/04/17 1328  PainSc: 0-No pain                 Catalina Gravel

## 2017-04-04 NOTE — Anesthesia Procedure Notes (Signed)
Procedure Name: LMA Insertion Date/Time: 04/04/2017 11:42 AM Performed by: Avraham Benish D Pre-anesthesia Checklist: Patient identified, Emergency Drugs available, Suction available and Patient being monitored Patient Re-evaluated:Patient Re-evaluated prior to induction Oxygen Delivery Method: Circle system utilized Preoxygenation: Pre-oxygenation with 100% oxygen Induction Type: IV induction Ventilation: Mask ventilation without difficulty LMA: LMA inserted LMA Size: 3.0 Number of attempts: 1 Airway Equipment and Method: Bite block Placement Confirmation: positive ETCO2 Tube secured with: Tape Dental Injury: Teeth and Oropharynx as per pre-operative assessment

## 2017-04-04 NOTE — Anesthesia Preprocedure Evaluation (Addendum)
Anesthesia Evaluation  Patient identified by MRN, date of birth, ID band Patient awake    Reviewed: Allergy & Precautions, NPO status , Patient's Chart, lab work & pertinent test results  Airway Mallampati: III  TM Distance: >3 FB Neck ROM: Full    Dental  (+) Teeth Intact, Dental Advisory Given   Pulmonary neg pulmonary ROS,    Pulmonary exam normal breath sounds clear to auscultation       Cardiovascular hypertension, Pt. on medications  Rhythm:Regular Rate:Tachycardia     Neuro/Psych PSYCHIATRIC DISORDERS Anxiety CVA (right hand), Residual Symptoms    GI/Hepatic Neg liver ROS, GERD  ,  Endo/Other  diabetes, Type 2, Oral Hypoglycemic Agents  Renal/GU negative Renal ROS     Musculoskeletal  (+) Arthritis , Fibromyalgia -  Abdominal   Peds  Hematology negative hematology ROS (+)   Anesthesia Other Findings Day of surgery medications reviewed with the patient.  Right breast cancer  Reproductive/Obstetrics                           Anesthesia Physical Anesthesia Plan  ASA: III  Anesthesia Plan: General   Post-op Pain Management:  Regional for Post-op pain   Induction: Intravenous  PONV Risk Score and Plan: 3 and Ondansetron, Dexamethasone and Treatment may vary due to age or medical condition  Airway Management Planned: LMA  Additional Equipment:   Intra-op Plan:   Post-operative Plan: Extubation in OR  Informed Consent: I have reviewed the patients History and Physical, chart, labs and discussed the procedure including the risks, benefits and alternatives for the proposed anesthesia with the patient or authorized representative who has indicated his/her understanding and acceptance.   Dental advisory given  Plan Discussed with: CRNA  Anesthesia Plan Comments: (Risks/benefits of general anesthesia discussed with patient including risk of damage to teeth, lips, gum, and  tongue, nausea/vomiting, allergic reactions to medications, and the possibility of heart attack, stroke and death.  All patient questions answered.  Patient wishes to proceed.)       Anesthesia Quick Evaluation

## 2017-04-04 NOTE — Op Note (Signed)
RIGHT BREAST PARTIAL MASTECTOMY WITH RADIOACTIVE SEED AND RIGHT SENTINEL LYMPH NODE BIOPSY  Procedure Note  Christina [name] 04/04/2017   Pre-op Diagnosis: RIGHT BREAST CANCER     Post-op Diagnosis: same  Procedure(s): RIGHT BREAST PARTIAL MASTECTOMY WITH RADIOACTIVE SEED LOCALIZATION AND RIGHT DEEP AXILLARY SENTINEL LYMPH NODE BIOPSY  Surgeon(s): Coralie Keens, MD  Anesthesia: General  Staff:  Circulator: Harrel Lemon, RN; Izora Ribas, RN Relief Scrub: Eston Esters, RN Scrub Person: Lorenza Burton, CST  Estimated Blood Loss: Minimal               Specimens: sent to path  Indications: This is [name] 76 year old female who was found to have invasive right breast cancer. The decision was made to proceed with [name] radioactive seed localized right breast partial mastectomy and sentinel lymph node biopsy  Procedure: The patient was identified in the holding area. She already had the radioactive seed placed in the right breast. The radiation technologist injected radioactive isotope underneath the areola of the breast. The patient was then taken to the operating room. She is placed in the spine position on the operating table and general anesthesia was induced. Her right breast and axilla were then prepped and draped in usual sterile fashion. I identified the radioactive seed with heated neoprobe in the upper outer quadrant of the right breast. I anesthetized the skin around the areola with Marcaine. I then made [name] circumareolar incision with [name] scalpel. I then dissected into the breast tissue with the cautery. I then performed [name] wide partial mastectomy staying around the radioactive seed with the aid of the neoprobe. I took the dissection down to the chest wall. Once the specimen was removed, I marked all margins pain. I then took more lateral margin and sent this separately. X-ray was performed on specimen and confirmed that the previous marker and radiated seed were in the  specimen.This was then sent to pathology for evaluation. Again, with the neoprobe, I identified an area of increased uptake in the right axilla. I again anesthetized the skin with Marcaine. I made an incision with [name] scalpel. I then dissected down to the deep right axillary tissue. Several simple lymph nodes were identified and excised with the cautery. These were then sent to pathology for evaluation. I again examined and no basin and no other increased uptake was identified. This point hemostasis appeared to be achieved in both incisions. I postsurgical clips in the cavity of the partial mastectomy site. Having closed all incisions with interrupted 3-0 Vicryl sutures and running 4-0 Monocryl sutures. Dermabond was applied. The patient was then placed in [name] breast binder. The patient tolerated procedure well. All the counts were correct at the end of the procedure. The patient was in [name] stated in the operating room and taken in [name] stable condition to recovery room.          Christina [name]   Date: 04/04/2017  Time: 12:33 PM

## 2017-04-04 NOTE — Interval H&P Note (Signed)
History and Physical Interval Note: no change in H and P  04/04/2017 10:00 AM  Christina [name]  has presented today for surgery, with the diagnosis of RIGHT BREAST CANCER  The various methods of treatment have been discussed with the patient and family. After consideration of risks, benefits and other options for treatment, the patient has consented to  Procedure(s): RIGHT BREAST PARTIAL MASTECTOMY WITH RADIOACTIVE SEED AND RIGHT SENTINEL LYMPH NODE BIOPSY (Right) as a surgical intervention .  The patient's history has been reviewed, patient examined, no change in status, stable for surgery.  I have reviewed the patient's chart and labs.  Questions were answered to the patient's satisfaction.     Christina [name] A

## 2017-04-05 ENCOUNTER — Encounter: Payer: Self-pay | Admitting: Hematology and Oncology

## 2017-04-05 ENCOUNTER — Encounter (HOSPITAL_BASED_OUTPATIENT_CLINIC_OR_DEPARTMENT_OTHER): Payer: Self-pay | Admitting: Surgery

## 2017-04-05 ENCOUNTER — Telehealth: Payer: Self-pay | Admitting: Hematology and Oncology

## 2017-04-05 DIAGNOSIS — C50911 Malignant neoplasm of unspecified site of right female breast: Secondary | ICD-10-CM | POA: Diagnosis not present

## 2017-04-05 LAB — GLUCOSE, CAPILLARY: Glucose-Capillary: 163 mg/dL — ABNORMAL HIGH (ref 65–99)

## 2017-04-05 MED ORDER — TRAMADOL HCL 50 MG PO TABS: 50.0000 mg | ORAL_TABLET | Freq: Four times a day (QID) | ORAL | 0 refills | Status: DC | PRN

## 2017-04-05 NOTE — Discharge Summary (Signed)
Physician Discharge Summary  Patient ID: Christina [name] MRN: [id number] DOB/AGE: 77-Nov-1942 77 y.o.  Admit date: 04/04/2017 Discharge date: 04/05/2017  Admission Diagnoses:  Discharge Diagnoses:  Active Problems:   Breast cancer [facility])   Discharged Condition: good  Hospital Course: uneventful post op recovery.  Discharged home POD #1  Consults: None  Significant Diagnostic Studies:   Treatments: surgery: radioactive seed localized right breast partial mastectomy and sentinel node biopsy  Discharge Exam: Blood pressure 137/80, pulse 82, temperature (!) 96.8 F (36 C), resp. rate 18, height 5\' 3"  (1.6 m), weight 69.9 kg (154 lb), SpO2 97 %. General appearance: alert, cooperative and no distress Resp: clear to auscultation bilaterally Cardio: regular rate and rhythm, S1, S2 normal, no murmur, click, rub or gallop Incision/Wound:incisions clean  Disposition: 01-Home or Self Care  Discharge Instructions    Discharge patient    Complete by:  As directed    Discharge disposition:  01-Home or Self Care   Discharge patient date:  04/05/2017     Allergies as of 04/05/2017      Reactions   Ciprofloxacin    Numbness    Citalopram    Intolerant   Codeine    Maxzide [hydrochlorothiazide W-triamterene]    Septra Ds [sulfamethoxazole-trimethoprim] Rash      Medication List    TAKE these medications   ALPRAZolam 0.5 MG tablet Commonly known as:  XANAX Take 0.5 mg by mouth 3 (three) times daily as needed. Anxiety.   aspirin 81 MG tablet Take 81 mg by mouth daily.   diltiazem 360 MG 24 hr capsule Commonly known as:  CARDIZEM CD 360 mg daily.   fluticasone 50 MCG/ACT nasal spray Commonly known as:  FLONASE Place 2 sprays into the nose as needed.   glimepiride 2 MG tablet Commonly known as:  AMARYL Take 2 mg by mouth daily before breakfast.   meloxicam 15 MG tablet Commonly known as:  MOBIC Take 15 mg by mouth daily.   metFORMIN 500 MG 24 hr tablet Commonly  known as:  GLUCOPHAGE-XR 500 mg daily.   nabumetone 500 MG tablet Commonly known as:  RELAFEN Take 500 mg by mouth 2 (two) times daily as needed for mild pain.   pravastatin 20 MG tablet Commonly known as:  PRAVACHOL Take 20 mg by mouth daily.   traMADol 50 MG tablet Commonly known as:  ULTRAM Take 1 tablet (50 mg total) by mouth every 6 (six) hours as needed. Fibromyalgia pain.   valACYclovir 1000 MG tablet Commonly known as:  VALTREX Take 1,000 mg by mouth 2 (two) times daily.      Follow-up Information    Coralie Keens, MD. Schedule an appointment as soon as possible for a visit in 3 week(s).   Specialty:  General Surgery Contact information: [address] [place] [address] [phone]           Signed: Harl Bowie 04/05/2017, 7:05 AM

## 2017-04-05 NOTE — Discharge Instructions (Signed)
[facility] °Office Phone Number [phone] ° °BREAST BIOPSY/ PARTIAL MASTECTOMY: POST OP INSTRUCTIONS ° °Always review your discharge instruction sheet given to you by the facility where your surgery was performed. ° °IF YOU HAVE DISABILITY OR FAMILY LEAVE FORMS, YOU MUST BRING THEM TO THE OFFICE FOR PROCESSING.  DO NOT GIVE THEM TO YOUR DOCTOR. ° °1. A prescription for pain medication may be given to you upon discharge.  Take your pain medication as prescribed, if needed.  If narcotic pain medicine is not needed, then you may take acetaminophen (Tylenol) or ibuprofen (Advil) as needed. °2. Take your usually prescribed medications unless otherwise directed °3. If you need a refill on your pain medication, please contact your pharmacy.  They will contact our office to request authorization.  Prescriptions will not be filled after 5pm or on week-ends. °4. You should eat very light the first 24 hours after surgery, such as soup, crackers, pudding, etc.  Resume your normal diet the day after surgery. °5. Most patients will experience some swelling and bruising in the breast.  Ice packs and a good support bra will help.  Swelling and bruising can take several days to resolve.  °6. It is common to experience some constipation if taking pain medication after surgery.  Increasing fluid intake and taking a stool softener will usually help or prevent this problem from occurring.  A mild laxative (Milk of Magnesia or Miralax) should be taken according to package directions if there are no bowel movements after 48 hours. °7. Unless discharge instructions indicate otherwise, you may remove your bandages 24-48 hours after surgery, and you may shower at that time.  You may have steri-strips (small skin tapes) in place directly over the incision.  These strips should be left on the skin for 7-10 days.  If your surgeon used skin glue on the incision, you may shower in 24 hours.  The glue will flake off over the  next 2-3 weeks.  Any sutures or staples will be removed at the office during your follow-up visit. °8. ACTIVITIES:  You may resume regular daily activities (gradually increasing) beginning the next day.  Wearing a good support bra or sports bra minimizes pain and swelling.  You may have sexual intercourse when it is comfortable. °a. You may drive when you no longer are taking prescription pain medication, you can comfortably wear a seatbelt, and you can safely maneuver your car and apply brakes. °b. RETURN TO WORK:  ______________________________________________________________________________________ °9. You should see your doctor in the office for a follow-up appointment approximately two weeks after your surgery.  Your doctor’s nurse will typically make your follow-up appointment when she calls you with your pathology report.  Expect your pathology report 2-3 business days after your surgery.  You may call to check if you do not hear from us after three days. °10. OTHER INSTRUCTIONS: _______________________________________________________________________________________________ _____________________________________________________________________________________________________________________________________ °_____________________________________________________________________________________________________________________________________ °_____________________________________________________________________________________________________________________________________ ° °WHEN TO CALL YOUR DOCTOR: °1. Fever over 101.0 °2. Nausea and/or vomiting. °3. Extreme swelling or bruising. °4. Continued bleeding from incision. °5. Increased pain, redness, or drainage from the incision. ° °The clinic staff is available to answer your questions during regular business hours.  Please don’t hesitate to call and ask to speak to one of the nurses for clinical concerns.  If you have a medical emergency, go to the nearest  emergency room or call 911.  A surgeon from [facility] is always on call at the hospital. ° °For further questions, please visit [email or web address]  °

## 2017-04-05 NOTE — Progress Notes (Signed)
Patient ID: Christina [name], female   DOB: [birth year], 76 y.o.   MRN: [id number]   Doing well Comfortable Incisions clean  Plan: Discharge home

## 2017-04-05 NOTE — Telephone Encounter (Signed)
Appt has been scheduled for the pt to see Dr. Lindi Adie on 10/25 at 1pm. Letter mailed to the pt.

## 2017-04-06 ENCOUNTER — Encounter: Payer: Self-pay | Admitting: Radiation Oncology

## 2017-04-12 ENCOUNTER — Ambulatory Visit (HOSPITAL_BASED_OUTPATIENT_CLINIC_OR_DEPARTMENT_OTHER): Payer: Medicare Other | Admitting: Hematology and Oncology

## 2017-04-12 ENCOUNTER — Telehealth: Payer: Self-pay | Admitting: Hematology and Oncology

## 2017-04-12 VITALS — BP 157/72 | HR 110 | Temp 97.5°F | Resp 18 | Ht 63.0 in | Wt 153.3 lb

## 2017-04-12 DIAGNOSIS — C50411 Malignant neoplasm of upper-outer quadrant of right female breast: Secondary | ICD-10-CM | POA: Diagnosis not present

## 2017-04-12 DIAGNOSIS — Z780 Asymptomatic menopausal state: Secondary | ICD-10-CM | POA: Diagnosis not present

## 2017-04-12 DIAGNOSIS — Z170 Estrogen receptor positive status [ER+]: Secondary | ICD-10-CM

## 2017-04-12 MED ORDER — ANASTROZOLE 1 MG PO TABS: 1.0000 mg | ORAL_TABLET | Freq: Every day | ORAL | 3 refills | Status: DC

## 2017-04-12 NOTE — Telephone Encounter (Signed)
Gave patient avs report and appointments for November and January, including bone density test.

## 2017-04-12 NOTE — Assessment & Plan Note (Signed)
04/04/2017 right lumpectomy: IDC grade 1, 1.2 cm, margins negative, 0/4 lymph nodes negative, ER 95%, PR 60%, HER-2 negative ratio 1.29, Ki-67 2%, T1 cN0 stage I a  Pathology counseling: I discussed the final pathology report of the patient provided  a copy of this report. I discussed the margins as well as lymph node surgeries. We also discussed the final staging along with previously performed ER/PR and HER-2/neu testing.  Recommendation: 1.  No role of systemic chemotherapy because of favorable prognostic panel as well as elderly age 76. +/- Adjuvant radiation followed by 3.  Adjuvant antiestrogen therapy  Return to clinic based on XRT decision reg radiation.

## 2017-04-12 NOTE — Progress Notes (Signed)
[facility] CONSULT NOTE  Patient Care Team: Shirline Frees, MD as PCP - General (Family Medicine)  CHIEF COMPLAINTS/PURPOSE OF CONSULTATION:  Newly diagnosed breast cancer  HISTORY OF PRESENTING ILLNESS:  Christina [name] 76 y.o. female is here because of recent diagnosis of right breast cancer.  Patient had a screening mammogram detected scattered areas of fibroglandular densities.  Additional compression views revealed but this abnormality measured 7 mm in size.  Ultrasound revealed some millimeter abnormality in the 930 position 3 cm from the nipple.  No abnormal lymph nodes were noted.  Biopsy of this mass revealed invasive ductal carcinoma that was ER PR positive.  She underwent right lumpectomy 04/04/2017 and final pathology revealed a grade 1 IDC measuring 1.2 cm and 4 lymph nodes were negative.  The tumor was ER PR positive HER-2 negative.  She was sent to [place] for discussion regarding adjuvant treatment options.  Patient was presented at the tumor board as well.  I reviewed her records extensively and collaborated the history with the patient.  SUMMARY OF ONCOLOGIC HISTORY:   Malignant neoplasm of upper-outer quadrant of right breast in female, estrogen receptor positive ([place])   03/21/2017 Initial Diagnosis    Malignant neoplasm of upper-outer quadrant of right breast in female, estrogen receptor positive ([place])      04/04/2017 Surgery    Right lumpectomy: IDC grade 1, 1.2 cm, margins negative, 0/4 lymph nodes negative, ER 95%, PR 60%, HER-2 negative ratio 1.29, Ki-67 2%, T1 cN0 stage I a      MEDICAL HISTORY:  Past Medical History:  Diagnosis Date  . Anxiety   . DDD (degenerative disc disease), lumbar    w spinal stenosis  . Diabetes mellitus without complication ([place])   . Fibromyalgia   . GERD (gastroesophageal reflux disease)   . H/O: CVA (cerebrovascular accident) 04/2012  . Hemorrhoids   . Hyperlipidemia   . Hypertension   . Ovarian cystic mass 11/14    R, nl CA-125, followed by Dr Deatra Ina.    SURGICAL HISTORY: Past Surgical History:  Procedure Laterality Date  . BREAST BIOPSY Right 10+ yrs ago  . BREAST LUMPECTOMY WITH RADIOACTIVE SEED AND SENTINEL LYMPH NODE BIOPSY Right 04/04/2017   Procedure: RIGHT BREAST PARTIAL MASTECTOMY WITH RADIOACTIVE SEED AND RIGHT SENTINEL LYMPH NODE BIOPSY;  Surgeon: Coralie Keens, MD;  Location: [facility];  Service: General;  Laterality: Right;  . CATARACT EXTRACTION Bilateral   . TUBAL LIGATION  1975   after last childbirth    SOCIAL HISTORY: Social History   Social History  . Marital status: Widowed    Spouse name: N/A  . Number of children: 4  . Years of education: 12   Occupational History  .      retired   Social History Main Topics  . Smoking status: Never Smoker  . Smokeless tobacco: Never Used  . Alcohol use No  . Drug use: No  . Sexual activity: Not on file   Other Topics Concern  . Not on file   Social History Narrative   Patient is a widow. Patient lives at home alone. Patient is retired.    Right handed.   Caffeine- two cups daily.    FAMILY HISTORY: Family History  Problem Relation Age of Onset  . Lung cancer Father   . Heart Problems Mother   . High blood pressure Mother   . Heart attack Mother   . Heart attack Maternal Grandmother   . Heart attack Maternal Grandfather  ALLERGIES:  is allergic to ciprofloxacin; citalopram; codeine; maxzide [hydrochlorothiazide w-triamterene]; and septra ds [sulfamethoxazole-trimethoprim].  MEDICATIONS:  Current Outpatient Prescriptions  Medication Sig Dispense Refill  . ALPRAZolam (XANAX) 0.5 MG tablet Take 0.5 mg by mouth 3 (three) times daily as needed. Anxiety.    Marland Kitchen anastrozole (ARIMIDEX) 1 MG tablet Take 1 tablet (1 mg total) by mouth daily. 90 tablet 3  . aspirin 81 MG tablet Take 81 mg by mouth daily.    Marland Kitchen diltiazem (CARDIZEM CD) 360 MG 24 hr capsule 360 mg daily.    . fluticasone (FLONASE) 50  MCG/ACT nasal spray Place 2 sprays into the nose as needed.     Marland Kitchen glimepiride (AMARYL) 2 MG tablet Take 2 mg by mouth daily before breakfast.    . meloxicam (MOBIC) 15 MG tablet Take 15 mg by mouth daily.    . metFORMIN (GLUCOPHAGE-XR) 500 MG 24 hr tablet 500 mg daily.    . nabumetone (RELAFEN) 500 MG tablet Take 500 mg by mouth 2 (two) times daily as needed for mild pain.    . pravastatin (PRAVACHOL) 20 MG tablet Take 20 mg by mouth daily.    . traMADol (ULTRAM) 50 MG tablet Take 1 tablet (50 mg total) by mouth every 6 (six) hours as needed. Fibromyalgia pain. 30 tablet 0  . valACYclovir (VALTREX) 1000 MG tablet Take 1,000 mg by mouth 2 (two) times daily.     No current facility-administered medications for this visit.     REVIEW OF SYSTEMS:   Constitutional: Denies fevers, chills or abnormal night sweats Eyes: Denies blurriness of vision, double vision or watery eyes Ears, nose, mouth, throat, and face: Denies mucositis or sore throat Respiratory: Denies cough, dyspnea or wheezes Cardiovascular: Denies palpitation, chest discomfort or lower extremity swelling Gastrointestinal:  Denies nausea, heartburn or change in bowel habits Skin: Denies abnormal skin rashes Lymphatics: Denies new lymphadenopathy or easy bruising Neurological:Denies numbness, tingling or new weaknesses Behavioral/Psych: Mood is stable, no new changes  Breast: Status post right lumpectomy All other systems were reviewed with the patient and are negative.  PHYSICAL EXAMINATION: ECOG PERFORMANCE STATUS: 1 - Symptomatic but completely ambulatory  Vitals:   04/12/17 1318  BP: (!) 157/72  Pulse: (!) 110  Resp: 18  Temp: (!) 97.5 F (36.4 C)  SpO2: 98%   Filed Weights   04/12/17 1318  Weight: 153 lb 4.8 oz (69.5 kg)    GENERAL:alert, no distress and comfortable SKIN: skin color, texture, turgor are normal, no rashes or significant lesions EYES: normal, conjunctiva are pink and non-injected, sclera  clear OROPHARYNX:no exudate, no erythema and lips, buccal mucosa, and tongue normal  NECK: supple, thyroid normal size, non-tender, without nodularity LYMPH:  no palpable lymphadenopathy in the cervical, axillary or inguinal LUNGS: clear to auscultation and percussion with normal breathing effort HEART: regular rate & rhythm and no murmurs and no lower extremity edema ABDOMEN:abdomen soft, non-tender and normal bowel sounds Musculoskeletal:no cyanosis of digits and no clubbing  PSYCH: alert & oriented x 3 with fluent speech NEURO: no focal motor/sensory deficits  LABORATORY DATA:  I have reviewed the data as listed Lab Results  Component Value Date   WBC 9.5 05/20/2016   HGB 14.9 05/20/2016   HCT 43.2 05/20/2016   MCV 88.1 05/20/2016   PLT 290 04/29/2012   Lab Results  Component Value Date   NA 136 04/03/2017   K 4.4 04/03/2017   CL 101 04/03/2017   CO2 25 04/03/2017    RADIOGRAPHIC STUDIES:  I have personally reviewed the radiological reports and agreed with the findings in the report.  ASSESSMENT AND PLAN:  Malignant neoplasm of upper-outer quadrant of right breast in female, estrogen receptor positive ([place]) 04/04/2017 right lumpectomy: IDC grade 1, 1.2 cm, margins negative, 0/4 lymph nodes negative, ER 95%, PR 60%, HER-2 negative ratio 1.29, Ki-67 2%, T1 cN0 stage I a  Pathology counseling: I discussed the final pathology report of the patient provided  a copy of this report. I discussed the margins as well as lymph node surgeries. We also discussed the final staging along with previously performed ER/PR and HER-2/neu testing.  Recommendation: 1.  No role of systemic chemotherapy because of favorable prognostic panel as well as elderly age 47. +/- Adjuvant radiation followed by 3.  Adjuvant antiestrogen therapy  Anastrozole counseling: We discussed the risks and benefits of anti-estrogen therapy with aromatase inhibitors. These include but not limited to insomnia, hot  flashes, mood changes, vaginal dryness, bone density loss, and weight gain. We strongly believe that the benefits far outweigh the risks. Patient understands these risks and consented to starting treatment. Planned treatment duration is 5 years.  Return to clinic based on XRT decision reg radiation. We will arrange a follow-up in 3 months with survivorship clinic for survivorship care plan All questions were answered. The patient knows to call the clinic with any problems, questions or concerns.    Rulon Eisenmenger, MD 04/12/17

## 2017-04-18 ENCOUNTER — Telehealth: Payer: Self-pay

## 2017-04-18 NOTE — Telephone Encounter (Signed)
Spoke with patient reminding of SCP visit on 04/24/17 at 2 pm.  She says she will be here for appt.

## 2017-04-19 NOTE — Progress Notes (Addendum)
Location of Breast Cancer: upper-outer quadrant of right breast   Histology per Pathology Report:   04/04/17 Diagnosis 1. Breast, lumpectomy, Right - INVASIVE DUCTAL CARCINOMA, GRADE I/III, SPANNING 1.2 CM. - THE SURGICAL RESECTION MARGINS ARE NEGATIVE FOR CARCINOMA. - SEE ONCOLOGY TABLE BELOW. 2. Lymph node, sentinel, biopsy, Right axillary - THERE IS NO EVIDENCE OF CARCINOMA IN 1 OF 1 LYMPH NODE (0/1). 3. Lymph node, sentinel, biopsy, Right axillary - THERE IS NO EVIDENCE OF CARCINOMA IN 1 OF 1 LYMPH NODE (0/1). 4. Lymph node, sentinel, biopsy, Right axillary - THERE IS NO EVIDENCE OF CARCINOMA IN 1 OF 1 LYMPH NODE (0/1). 5. Lymph node, sentinel, biopsy, Right axillary - THERE IS NO EVIDENCE OF CARCINOMA IN 1 OF 1 LYMPH NODE (0/1). 6. Breast, excision, Right additional lateral margin - BENIGN BREAST PARENCHYMA. - VASCULAR CALCIFICATIONS. - THERE IS NO EVIDENCE OF MALIGNANCY.  03/21/17 Diagnosis Breast, right, needle core biopsy, 9:30 o'clock, 3cmfn - INVASIVE DUCTAL CARCINOMA, GRADE 1. - DUCTAL CARCINOMA IN SITU.  Receptor Status: ER(95%), PR (60%), Her2-neu (neg), Ki-(2%)  Did patient present with symptoms (if so, please note symptoms) or was this found on screening mammography?: screening mammogram  Past/Anticipated interventions by surgeon, if any: 04/04/17 - Procedure: RIGHT BREAST PARTIAL MASTECTOMY WITH RADIOACTIVE SEED AND RIGHT SENTINEL LYMPH NODE BIOPSY;  Surgeon: Coralie Keens, MD  Past/Anticipated interventions by medical oncology, if any: Adjuvant antiestrogen therapy - tamoxifen for 5 years - has not started yet.  Lymphedema issues, if any:  no  Pain issues, if any:  no   SAFETY ISSUES:  Prior radiation? no  Pacemaker/ICD? no  Possible current pregnancy?no  Is the patient on methotrexate? no  Current Complaints / other details:  Patient is here with her sister.   BP (!) 150/76 (BP Location: Right Arm, Patient Position: Sitting)   Pulse (!) 115    Temp 98.6 F (37 C) (Oral)   Ht '5\' 3"'  (1.6 m)   Wt 152 lb 9.6 oz (69.2 kg)   SpO2 99%   BMI 27.03 kg/m    Wt Readings from Last 3 Encounters:  04/25/17 152 lb 9.6 oz (69.2 kg)  04/12/17 153 lb 4.8 oz (69.5 kg)  03/29/17 154 lb (69.9 kg)      Hess, Craige Cotta, RN 04/19/2017,10:30 AM

## 2017-04-24 ENCOUNTER — Telehealth: Payer: Self-pay | Admitting: Adult Health

## 2017-04-24 ENCOUNTER — Encounter: Payer: Medicare Other | Admitting: Adult Health

## 2017-04-24 NOTE — Telephone Encounter (Signed)
Spoke to patient regarding upcoming January appointments. Patient scheduled per 11/5 sch message.

## 2017-04-25 ENCOUNTER — Ambulatory Visit
Admission: RE | Admit: 2017-04-25 | Discharge: 2017-04-25 | Disposition: A | Discharge status: Home / Self Care | Payer: Medicare Other | Source: Ambulatory Visit | Attending: Radiation Oncology | Admitting: Radiation Oncology

## 2017-04-25 ENCOUNTER — Encounter: Payer: Self-pay | Admitting: Radiation Oncology

## 2017-04-25 DIAGNOSIS — E785 Hyperlipidemia, unspecified: Secondary | ICD-10-CM | POA: Diagnosis not present

## 2017-04-25 DIAGNOSIS — C50411 Malignant neoplasm of upper-outer quadrant of right female breast: Secondary | ICD-10-CM | POA: Diagnosis not present

## 2017-04-25 DIAGNOSIS — C50911 Malignant neoplasm of unspecified site of right female breast: Secondary | ICD-10-CM | POA: Insufficient documentation

## 2017-04-25 DIAGNOSIS — Z9889 Other specified postprocedural states: Secondary | ICD-10-CM | POA: Diagnosis not present

## 2017-04-25 DIAGNOSIS — Z170 Estrogen receptor positive status [ER+]: Secondary | ICD-10-CM | POA: Diagnosis not present

## 2017-04-25 DIAGNOSIS — K219 Gastro-esophageal reflux disease without esophagitis: Secondary | ICD-10-CM | POA: Diagnosis not present

## 2017-04-25 DIAGNOSIS — I10 Essential (primary) hypertension: Secondary | ICD-10-CM | POA: Insufficient documentation

## 2017-04-25 HISTORY — DX: Malignant neoplasm of unspecified site of right female breast: C50.911

## 2017-04-25 NOTE — Progress Notes (Signed)
Radiation Oncology         [phone] ________________________________  Initial Outpatient Consultation  Name: Christina [name] MRN: [id number]  Date: 04/25/2017  DOB: 13-Nov-1940  [id number], [name], MD  Coralie Keens, MD   REFERRING PHYSICIAN: Coralie Keens, MD  DIAGNOSIS: The encounter diagnosis was Malignant neoplasm of upper-outer quadrant of right breast in female, estrogen receptor positive ([place]). IDC grade 1, 1.2 cm, margins negative, 0/4 lymph nodes negative, ER 95%, PR 60%, HER-2 negative ratio 1.29, Ki-67 2%, pT1 cN0, stage I a   HISTORY OF PRESENT ILLNESS::Christina [name] is a 76 y.o. female who presents today for further evaluation of right breast cancer.  Of note, she initially had a screening mammogram on 03/16/17 showing a persistent suspicious mass of the right breast at the 9:30 location. Then, on 04/04/17, a right breast lumpectomy was performed revealing invasive ductal carcinoma, grade I/III with negative margins. One right axillary sentinel lymph node was checked showing no evidence of carcinoma.   Pt presents to the office today accompanied by her sister. She reports that they are doing well overall. Her surgery was done by Dr. Ninfa Linden, she was seen by Dr. Lindi Adie who recommended adjuvant hormonal therapy for 5 years but she has not started this as of yet.    On review of systems, pt denies fever, chills, weight loss, decreased appetite, decreased energy levels. Denies pain. Pt denies abdominal pain, nausea, vomiting.   PREVIOUS RADIATION THERAPY: No  PAST MEDICAL HISTORY:  has a past medical history of Anxiety, DDD (degenerative disc disease), lumbar, Diabetes mellitus without complication ([place]), Fibromyalgia, GERD (gastroesophageal reflux disease), H/O: CVA (cerebrovascular accident) (04/2012), Hemorrhoids, Hyperlipidemia, Hypertension, and Ovarian cystic mass (11/14).    PAST SURGICAL HISTORY: Past Surgical History:  Procedure Laterality Date  . BREAST  BIOPSY Right 10+ yrs ago  . CATARACT EXTRACTION Bilateral   . TUBAL LIGATION  1975   after last childbirth    FAMILY HISTORY: family history includes Heart Problems in her mother; Heart attack in her maternal grandfather, maternal grandmother, and mother; High blood pressure in her mother; Lung cancer in her father.  SOCIAL HISTORY:  reports that  has never smoked. she has never used smokeless tobacco. She reports that she does not drink alcohol or use drugs.  ALLERGIES: Ciprofloxacin; Citalopram; Codeine; Maxzide [hydrochlorothiazide w-triamterene]; and Septra ds [sulfamethoxazole-trimethoprim]  MEDICATIONS:  Current Outpatient Medications  Medication Sig Dispense Refill  . ALPRAZolam (XANAX) 0.5 MG tablet Take 0.5 mg by mouth 3 (three) times daily as needed. Anxiety.    Marland Kitchen amLODipine (NORVASC) 10 MG tablet TK 1 T PO QD FOR HIGH BP  2  . aspirin 81 MG tablet Take 81 mg by mouth daily.    Marland Kitchen diltiazem (CARDIZEM CD) 360 MG 24 hr capsule 360 mg daily.    . fluticasone (FLONASE) 50 MCG/ACT nasal spray Place 2 sprays into the nose as needed.     Marland Kitchen glimepiride (AMARYL) 2 MG tablet Take 2 mg by mouth daily before breakfast.    . hydrochlorothiazide (HYDRODIURIL) 12.5 MG tablet hydrochlorothiazide 12.5 mg tablet  TK 1 T PO QD IN THE MORNING    . irbesartan (AVAPRO) 300 MG tablet TK 1 T PO QD  5  . metFORMIN (GLUCOPHAGE-XR) 500 MG 24 hr tablet 500 mg daily.    . pravastatin (PRAVACHOL) 20 MG tablet Take 20 mg by mouth daily.    . traMADol (ULTRAM) 50 MG tablet Take 1 tablet (50 mg total) by mouth every 6 (six) hours as  needed. Fibromyalgia pain. 30 tablet 0  . anastrozole (ARIMIDEX) 1 MG tablet Take 1 tablet (1 mg total) by mouth daily. (Patient not taking: Reported on 04/25/2017) 90 tablet 3  . nabumetone (RELAFEN) 500 MG tablet Take 500 mg by mouth 2 (two) times daily as needed for mild pain.    . valACYclovir (VALTREX) 1000 MG tablet Take 1,000 mg by mouth 2 (two) times daily.     No current  facility-administered medications for this encounter.     REVIEW OF SYSTEMS:  A 10+ POINT REVIEW OF SYSTEMS WAS OBTAINED including neurology, dermatology, psychiatry, cardiac, respiratory, lymph, extremities, GI, GU, Musculoskeletal, constitutional, breasts, reproductive, HEENT.  All pertinent positives are noted in the HPI.  All others are negative.   PHYSICAL EXAM:  height is '5\' 3"'  (1.6 m) and weight is 152 lb 9.6 oz (69.2 kg). Her oral temperature is 98.6 F (37 C). Her blood pressure is 150/76 (abnormal) and her pulse is 115 (abnormal). Her oxygen saturation is 99%.   Lungs are clear to auscultation bilaterally. Heart has regular rate and rhythm. No palpable cervical, supraclavicular, or axillary adenopathy. Abdomen soft, non-tender, normal bowel sounds. Breasts: Left breast with no palpable mass, nipple discharge or bleeding. Patient has a chronically inverted left nipple. Right breast with peri-areolar scar in the lateral aspect of the breast. Healing well. Patient appears to have some fluid in the 9 o'clock position of the right breast with some erythema. No obvious signs of infection. Patient also has a separate scar to the right axillary region secondary to her sentinel node procedure.   ECOG = 1  LABORATORY DATA:  Lab Results  Component Value Date   WBC 9.5 05/20/2016   HGB 14.9 05/20/2016   HCT 43.2 05/20/2016   MCV 88.1 05/20/2016   PLT 290 04/29/2012   NEUTROABS 6.7 04/29/2012   Lab Results  Component Value Date   NA 136 04/03/2017   K 4.4 04/03/2017   CL 101 04/03/2017   CO2 25 04/03/2017   GLUCOSE 208 (H) 04/03/2017   CREATININE 0.66 04/03/2017   CALCIUM 9.6 04/03/2017      RADIOGRAPHY: Nm Sentinel Node Inj-no Rpt (breast)  Result Date: 04/18/2017 There is no Radiologist interpretation  for this exam.  Mm Breast Surgical Specimen  Result Date: 04/04/2017 CLINICAL DATA:  Post right breast lumpectomy. EXAM: SPECIMEN RADIOGRAPH OF THE RIGHT BREAST COMPARISON:   Previous exam(s). FINDINGS: Status post excision of the right breast. The radioactive seed and biopsy marker clip are present, completely intact, and were marked for pathology. There are wide radiographic margins. IMPRESSION: Specimen radiograph of the right breast. Electronically Signed   By: Fidela Salisbury M.D.   On: 04/04/2017 12:06   [place] Rt Radioactive Seed Loc  Result Date: 04/03/2017 CLINICAL DATA:  Patient for preoperative localization prior to right lumpectomy. EXAM: ULTRASOUND GUIDED RADIOACTIVE SEED LOCALIZATION OF THE RIGHT BREAST COMPARISON:  Previous exam(s). FINDINGS: Patient presents for radioactive seed localization prior to right lumpectomy. I met with the patient and we discussed the procedure of seed localization including benefits and alternatives. We discussed the high likelihood of a successful procedure. We discussed the risks of the procedure including infection, bleeding, tissue injury and further surgery. We discussed the low dose of radioactivity involved in the procedure. Informed, written consent was given. The usual time-out protocol was performed immediately prior to the procedure. Using ultrasound guidance, sterile technique, 1% lidocaine and an I-125 radioactive seed, mass and biopsy marking clip within the lateral right breast  were localized using a lateral approach. The follow-up mammogram images confirm the seed in the expected location and were marked for Dr. Ninfa Linden. Follow-up survey of the patient confirms presence of the radioactive seed. Order number of I-125 seed:  [id number]. Total activity:  3.794 millicurie         Reference Date: 03/30/2017 The patient tolerated the procedure well and was released from the [facility]. She was given instructions regarding seed removal. IMPRESSION: Radioactive seed localization right breast. No apparent complications. Electronically Signed   By: Lovey Newcomer M.D.   On: 04/03/2017 15:04   Mm Clip Placement Right  Result Date:  04/03/2017 CLINICAL DATA:  Patient status post radioactive seed placement for preoperative localization right breast. EXAM: DIAGNOSTIC RIGHT MAMMOGRAM POST ULTRASOUND-GUIDED RADIOACTIVE SEED PLACEMENT COMPARISON:  Previous exam(s). FINDINGS: Mammographic images were obtained following ultrasound-guided radioactive seed placement. These demonstrate the radioactive seed to be located adjacent to the ribbon shaped marking clip within the lateral right breast. IMPRESSION: Appropriate location of the radioactive seed. Final Assessment: Post Procedure Mammograms for Seed Placement Electronically Signed   By: Lovey Newcomer M.D.   On: 04/03/2017 15:05      IMPRESSION: IDC grade 1, 1.2 cm, margins negative, 0/4 lymph nodes negative, ER 95%, PR 60%, HER-2 negative ratio 1.29, Ki-67 2%, pT1 cN0, stage I a  Patient has had successful surgery with removal of the tumor with clear margins, no lymph node metastasis. We discussed options for post op management including adjuvant hormonal therapy, adjuvant radiation therapy or both. Given the patient's excellent prognosis I feel she would be fine doing hormonal therapy or radiation treatment with low risk of recurrence. After careful evaluation, the patient would like to proceed with adjuvant hormonal therapy alone.    PLAN: PRN follow up with radiation oncology. Patient will follow up closely with medical oncology and surgery. Recommended pt call Dr. Ninfa Linden if swelling and erythema progresses in the right breast. She will otherwise see him on 04/30/17 for follow up.    ------------------------------------------------  Blair Promise, PhD, MD  This document serves as a record of services personally performed by Gery Pray, MD. It was created on her behalf by Marlowe Kays, a trained medical scribe. The creation of this record is based on the scribe's personal observations and the provider's statements to them. This document has been checked and approved by the  attending provider.

## 2017-05-07 ENCOUNTER — Ambulatory Visit
Admission: RE | Admit: 2017-05-07 | Discharge: 2017-05-07 | Disposition: A | Discharge status: Home / Self Care | Payer: Medicare Other | Source: Ambulatory Visit | Attending: Hematology and Oncology | Admitting: Hematology and Oncology

## 2017-05-07 DIAGNOSIS — Z780 Asymptomatic menopausal state: Secondary | ICD-10-CM | POA: Diagnosis not present

## 2017-05-07 DIAGNOSIS — M85852 Other specified disorders of bone density and structure, left thigh: Secondary | ICD-10-CM | POA: Diagnosis not present

## 2017-07-11 ENCOUNTER — Telehealth: Payer: Self-pay

## 2017-07-11 DIAGNOSIS — J0101 Acute recurrent maxillary sinusitis: Secondary | ICD-10-CM | POA: Diagnosis not present

## 2017-07-11 DIAGNOSIS — E1142 Type 2 diabetes mellitus with diabetic polyneuropathy: Secondary | ICD-10-CM | POA: Diagnosis not present

## 2017-07-11 DIAGNOSIS — E1165 Type 2 diabetes mellitus with hyperglycemia: Secondary | ICD-10-CM | POA: Diagnosis not present

## 2017-07-11 DIAGNOSIS — I872 Venous insufficiency (chronic) (peripheral): Secondary | ICD-10-CM | POA: Diagnosis not present

## 2017-07-11 NOTE — Telephone Encounter (Signed)
Left vm for patient reminding of [facility] appt on 07/12/17 at 2 pm.  Left number for call back with questions/concerns.

## 2017-07-12 ENCOUNTER — Encounter: Payer: Self-pay | Admitting: Adult Health

## 2017-07-12 ENCOUNTER — Telehealth: Payer: Self-pay | Admitting: Adult Health

## 2017-07-12 ENCOUNTER — Inpatient Hospital Stay: Payer: Medicare Other | Attending: Hematology and Oncology | Admitting: Adult Health

## 2017-07-12 VITALS — BP 137/81 | HR 118 | Temp 97.5°F | Resp 20 | Ht 63.0 in | Wt 155.8 lb

## 2017-07-12 DIAGNOSIS — Z170 Estrogen receptor positive status [ER+]: Secondary | ICD-10-CM | POA: Diagnosis not present

## 2017-07-12 DIAGNOSIS — Z79811 Long term (current) use of aromatase inhibitors: Secondary | ICD-10-CM | POA: Diagnosis not present

## 2017-07-12 DIAGNOSIS — C50411 Malignant neoplasm of upper-outer quadrant of right female breast: Secondary | ICD-10-CM | POA: Diagnosis not present

## 2017-07-12 NOTE — Progress Notes (Signed)
CLINIC:  Survivorship   REASON FOR VISIT:  Routine follow-up post-treatment for a recent history of breast cancer.  BRIEF ONCOLOGIC HISTORY:    Malignant neoplasm of upper-outer quadrant of right breast in female, estrogen receptor positive ([place])   03/21/2017 Initial Diagnosis    Malignant neoplasm of upper-outer quadrant of right breast in female, estrogen receptor positive ([place])      04/04/2017 Surgery    Right lumpectomy: IDC grade 1, 1.2 cm, margins negative, 0/4 lymph nodes negative, ER 95%, PR 60%, HER-2 negative ratio 1.29, Ki-67 2%, T1 cN0 stage I a      03/2017 -  Anti-estrogen oral therapy    Anastrozole daily       INTERVAL HISTORY:  Ms. Avalos presents to the [facility] today for our initial meeting to review her survivorship care plan detailing her treatment course for breast cancer, as well as monitoring long-term side effects of that treatment, education regarding health maintenance, screening, and overall wellness and health promotion.     Overall, Ms. Moralez reports feeling moderately well.  She is taking Anastrozole daily.  She has h/o fibromyalgia.  She has felt some aches and pains.  She takes tramadol prn pain and xanax if needed for anxiety.  She does note some mild side effects from the Anastrozole.  Otherwise, she is doing well today and is without questions/concnerns.      REVIEW OF SYSTEMS:  Review of Systems  Constitutional: Negative for appetite change, chills, fatigue, fever and unexpected weight change.  HENT:   Negative for hearing loss and lump/mass.   Eyes: Negative for eye problems and icterus.  Respiratory: Negative for chest tightness and shortness of breath.   Cardiovascular: Negative for chest pain, leg swelling and palpitations.  Gastrointestinal: Negative for abdominal distention and abdominal pain.  Endocrine: Negative for hot flashes.  Musculoskeletal: Negative for arthralgias and neck stiffness.  Skin: Negative for itching  and rash.  Neurological: Negative for dizziness, extremity weakness and headaches.  Hematological: Negative for adenopathy. Does not bruise/bleed easily.  Psychiatric/Behavioral: Negative for depression. The patient is not nervous/anxious.    Breast: Denies any new nodularity, masses, tenderness, nipple changes, or nipple discharge.      ONCOLOGY TREATMENT TEAM:  1. Surgeon:  Dr. [facility]infa [facility] at [facility] 2. Medical Oncologist: Dr. Lindi Adie  3. Radiation Oncologist: Dr. Sondra Come    PAST MEDICAL/SURGICAL HISTORY:  Past Medical History:  Diagnosis Date  . Anxiety   . Breast cancer, right ([place])   . DDD (degenerative disc disease), lumbar    w spinal stenosis  . Diabetes mellitus without complication ([place])   . Fibromyalgia   . GERD (gastroesophageal reflux disease)   . H/O: CVA (cerebrovascular accident) 04/2012  . Hemorrhoids   . Hyperlipidemia   . Hypertension   . Ovarian cystic mass 11/14   R, nl CA-125, followed by Dr Deatra Ina.   Past Surgical History:  Procedure Laterality Date  . BREAST BIOPSY Right 10+ yrs ago  . BREAST LUMPECTOMY WITH RADIOACTIVE SEED AND SENTINEL LYMPH NODE BIOPSY Right 04/04/2017   Procedure: RIGHT BREAST PARTIAL MASTECTOMY WITH RADIOACTIVE SEED AND RIGHT SENTINEL LYMPH NODE BIOPSY;  Surgeon: Coralie Keens, MD;  Location: [facility];  Service: General;  Laterality: Right;  . CATARACT EXTRACTION Bilateral   . TUBAL LIGATION  1975   after last childbirth     ALLERGIES:  Allergies  Allergen Reactions  . Ciprofloxacin     Numbness   . Citalopram  Intolerant  . Codeine   . Maxzide [Hydrochlorothiazide W-Triamterene]   . Septra Ds [Sulfamethoxazole-Trimethoprim] Rash     CURRENT MEDICATIONS:  Outpatient Encounter Medications as of 07/12/2017  Medication Sig  . ALPRAZolam (XANAX) 0.5 MG tablet Take 0.5 mg by mouth 3 (three) times daily as needed. Anxiety.  Marland Kitchen amLODipine (NORVASC) 10 MG tablet TK 1 T PO QD FOR HIGH  BP  . anastrozole (ARIMIDEX) 1 MG tablet Take 1 tablet (1 mg total) by mouth daily. (Patient not taking: Reported on 04/25/2017)  . aspirin 81 MG tablet Take 81 mg by mouth daily.  Marland Kitchen diltiazem (CARDIZEM CD) 360 MG 24 hr capsule 360 mg daily.  . fluticasone (FLONASE) 50 MCG/ACT nasal spray Place 2 sprays into the nose as needed.   Marland Kitchen glimepiride (AMARYL) 2 MG tablet Take 2 mg by mouth daily before breakfast.  . hydrochlorothiazide (HYDRODIURIL) 12.5 MG tablet hydrochlorothiazide 12.5 mg tablet  TK 1 T PO QD IN THE MORNING  . irbesartan (AVAPRO) 300 MG tablet TK 1 T PO QD  . metFORMIN (GLUCOPHAGE-XR) 500 MG 24 hr tablet 500 mg daily.  . nabumetone (RELAFEN) 500 MG tablet Take 500 mg by mouth 2 (two) times daily as needed for mild pain.  . pravastatin (PRAVACHOL) 20 MG tablet Take 20 mg by mouth daily.  . traMADol (ULTRAM) 50 MG tablet Take 1 tablet (50 mg total) by mouth every 6 (six) hours as needed. Fibromyalgia pain.  . valACYclovir (VALTREX) 1000 MG tablet Take 1,000 mg by mouth 2 (two) times daily.   No facility-administered encounter medications on file as of 07/12/2017.      ONCOLOGIC FAMILY HISTORY:  Family History  Problem Relation Age of Onset  . Lung cancer Father   . Heart Problems Mother   . High blood pressure Mother   . Heart attack Mother   . Heart attack Maternal Grandmother   . Heart attack Maternal Grandfather      GENETIC COUNSELING/TESTING: Not at this time  SOCIAL HISTORY:  Abbegail Matuska is single and lives alone in [place], [place].  She has two children who live in the area.  Ms. Penning is currently retired.  She denies any current or history of tobacco, alcohol, or illicit drug use.     PHYSICAL EXAMINATION:  Vital Signs:   Vitals:   07/12/17 1445  BP: 137/81  Pulse: (!) 118  Resp: 20  Temp: (!) 97.5 F (36.4 C)  SpO2: 96%   Filed Weights   07/12/17 1445  Weight: 155 lb 12.8 oz (70.7 kg)   General: Well-nourished, well-appearing female in no  acute distress.  She is accompanied in clinic by her friend today.   HEENT: Head is normocephalic.  Pupils equal and reactive to light. Conjunctivae clear without exudate.  Sclerae anicteric. Oral mucosa is pink, moist.  Oropharynx is pink without lesions or erythema.  Lymph: No cervical, supraclavicular, or infraclavicular lymphadenopathy noted on palpation.  Cardiovascular: Regular rate and rhythm.Marland Kitchen Respiratory: Clear to auscultation bilaterally. Chest expansion symmetric; breathing non-labored.  Breast: right breast s/p lumpectomy, no nodules, masses, skin changes, nipple on right breast is everted (inverted prior to surgery), left breast without nodules, masses, skin or nipple changes, left breast nipple is inverted GI: Abdomen soft and round; non-tender, non-distended. Bowel sounds normoactive.  GU: Deferred.  Neuro: No focal deficits. Steady gait.  Psych: Mood and affect normal and appropriate for situation.  Extremities: No edema. MSK: No focal spinal tenderness to palpation.  Full range of motion in  bilateral upper extremities Skin: Warm and dry.  LABORATORY DATA:  None for this visit.  DIAGNOSTIC IMAGING:  None for this visit.      ASSESSMENT AND PLAN:  Ms.. Popiel is a pleasant 77 y.o. female with Stage IA right breast invasive ductal carcinoma, ER+/PR+/HER2-, diagnosed in 03/2017, treated with lumpectomy and anti-estrogen therapy with Anastrozole beginning in 03/2017.  She presents to the Survivorship Clinic for our initial meeting and routine follow-up post-completion of treatment for breast cancer.    1. Stage IA right breast cancer:  Ms. Mago is continuing to recover from definitive treatment for breast cancer. She will follow-up with her medical oncologist, Dr. Lindi Adie in 6 months with history and physical exam per surveillance protocol.  She will continue her anti-estrogen therapy with Anastrozole. Thus far, she is tolerating the Anastrozole well, with minimal side effects.  Today, a comprehensive survivorship care plan and treatment summary was reviewed with the patient today detailing her breast cancer diagnosis, treatment course, potential late/long-term effects of treatment, appropriate follow-up care with recommendations for the future, and patient education resources.  A copy of this summary, along with a letter will be sent to the patient's primary care provider via mail/fax/In Basket message after today's visit.    2. Bone health:  Given Ms. Cabreja's age/history of breast cancer and her current treatment regimen including anti-estrogen therapy with Anastrozole, she is at risk for bone demineralization.  Her last DEXA scan was 04/2017 and was normal.  She will need this repeated in 04/2019. In the meantime, she was encouraged to increase her consumption of foods rich in calcium, as well as increase her weight-bearing activities.  She was given education on specific activities to promote bone health.  3. Cancer screening:  Due to Ms. Kunkle's history and her age, she should receive screening for skin cancers, colon cancer, and gynecologic cancers.  The information and recommendations are listed on the patient's comprehensive care plan/treatment summary and were reviewed in detail with the patient.    4. Health maintenance and wellness promotion: Ms. Shinn was encouraged to consume 5-7 servings of fruits and vegetables per day. We reviewed the "Nutrition Rainbow" handout, as well as the handout "Take Control of Your Health and Reduce Your Cancer Risk" from the [facility].  She was also encouraged to engage in moderate to vigorous exercise for 30 minutes per day most days of the week. We discussed the [employer] fitness program, which is designed for cancer survivors to help them become more physically fit after cancer treatments.  She was instructed to limit her alcohol consumption and continue to abstain from tobacco use.     5. Support  services/counseling: It is not uncommon for this period of the patient's cancer care trajectory to be one of many emotions and stressors.  We discussed an opportunity for her to participate in the next session of [facility] ("Finding Your New Normal") support group series designed for patients after they have completed treatment.   Ms. Dame was encouraged to take advantage of our many other support services programs, support groups, and/or counseling in coping with her new life as a cancer survivor after completing anti-cancer treatment.  She was offered support today through active listening and expressive supportive counseling.  She was given information regarding our available services and encouraged to contact me with any questions or for help enrolling in any of our support group/programs.    Dispo:   -Return to cancer center for follow up with Dr.  Gudena in 6 months -Mammogram due in 02/2018 -Bone density due in 04/2019 -Follow up with Dr. Ninfa Linden 03/2018 -She is welcome to return back to the Survivorship Clinic at any time; no additional follow-up needed at this time.  -Consider referral back to survivorship as a long-term survivor for continued surveillance  A total of (50) minutes of face-to-face time was spent with this patient with greater than 50% of that time in counseling and care-coordination.   Gardenia Phlegm, [place] [phone]   Note: PRIMARY CARE PROVIDER Shirline Frees, [place] [phone]

## 2017-07-12 NOTE — Telephone Encounter (Signed)
Gave patient avs and calendar with appts per 1/24 los.  °

## 2017-10-16 DIAGNOSIS — I10 Essential (primary) hypertension: Secondary | ICD-10-CM | POA: Diagnosis not present

## 2017-10-16 DIAGNOSIS — E782 Mixed hyperlipidemia: Secondary | ICD-10-CM | POA: Diagnosis not present

## 2017-10-16 DIAGNOSIS — M797 Fibromyalgia: Secondary | ICD-10-CM | POA: Diagnosis not present

## 2017-10-16 DIAGNOSIS — E1142 Type 2 diabetes mellitus with diabetic polyneuropathy: Secondary | ICD-10-CM | POA: Diagnosis not present

## 2017-10-18 DIAGNOSIS — N762 Acute vulvitis: Secondary | ICD-10-CM | POA: Diagnosis not present

## 2017-11-21 DIAGNOSIS — C50911 Malignant neoplasm of unspecified site of right female breast: Secondary | ICD-10-CM | POA: Diagnosis not present

## 2018-01-15 DIAGNOSIS — N3001 Acute cystitis with hematuria: Secondary | ICD-10-CM | POA: Diagnosis not present

## 2018-01-15 DIAGNOSIS — F419 Anxiety disorder, unspecified: Secondary | ICD-10-CM | POA: Diagnosis not present

## 2018-01-15 DIAGNOSIS — E1165 Type 2 diabetes mellitus with hyperglycemia: Secondary | ICD-10-CM | POA: Diagnosis not present

## 2018-01-15 DIAGNOSIS — F324 Major depressive disorder, single episode, in partial remission: Secondary | ICD-10-CM | POA: Diagnosis not present

## 2018-01-16 ENCOUNTER — Inpatient Hospital Stay: Payer: Medicare Other | Attending: Hematology and Oncology | Admitting: Hematology and Oncology

## 2018-01-16 DIAGNOSIS — Z79899 Other long term (current) drug therapy: Secondary | ICD-10-CM | POA: Diagnosis not present

## 2018-01-16 DIAGNOSIS — Z7982 Long term (current) use of aspirin: Secondary | ICD-10-CM

## 2018-01-16 DIAGNOSIS — Z7984 Long term (current) use of oral hypoglycemic drugs: Secondary | ICD-10-CM | POA: Insufficient documentation

## 2018-01-16 DIAGNOSIS — E1165 Type 2 diabetes mellitus with hyperglycemia: Secondary | ICD-10-CM | POA: Diagnosis not present

## 2018-01-16 DIAGNOSIS — Z9223 Personal history of estrogen therapy: Secondary | ICD-10-CM

## 2018-01-16 DIAGNOSIS — Z170 Estrogen receptor positive status [ER+]: Secondary | ICD-10-CM | POA: Diagnosis not present

## 2018-01-16 DIAGNOSIS — Z853 Personal history of malignant neoplasm of breast: Secondary | ICD-10-CM

## 2018-01-16 DIAGNOSIS — C50411 Malignant neoplasm of upper-outer quadrant of right female breast: Secondary | ICD-10-CM

## 2018-01-16 NOTE — Progress Notes (Signed)
Patient Care Team: Shirline Frees, MD as PCP - General (Family Medicine) Nicholas Lose, MD as Consulting Physician (Hematology and Oncology) Gery Pray, MD as Consulting Physician (Radiation Oncology) Delice Bison Charlestine Massed, NP as Nurse Practitioner (Hematology and Oncology) Coralie Keens, MD as Consulting Physician (General Surgery)  DIAGNOSIS:  Encounter Diagnosis  Name Primary?  . Malignant neoplasm of upper-outer quadrant of right breast in female, estrogen receptor positive ([place])     SUMMARY OF ONCOLOGIC HISTORY:   Malignant neoplasm of upper-outer quadrant of right breast in female, estrogen receptor positive ([place])   03/21/2017 Initial Diagnosis    Malignant neoplasm of upper-outer quadrant of right breast in female, estrogen receptor positive ([place])      04/04/2017 Surgery    Right lumpectomy: IDC grade 1, 1.2 cm, margins negative, 0/4 lymph nodes negative, ER 95%, PR 60%, HER-2 negative ratio 1.29, Ki-67 2%, T1 cN0 stage I a      03/2017 - 09/2017 Anti-estrogen oral therapy    Anastrozole daily       CHIEF COMPLIANT: Stopped anastrozole therapy due to side effects  INTERVAL HISTORY: Christina [name] is a 77 year old with above-mentioned history of right breast cancer underwent lumpectomy and was on anastrozole therapy.  She stopped it in April 2019 because of multiple side effects which included emotional problems, depression, fatigue she was not like herself and once she stopped anastrozole all the symptoms went away.  She is feeling much better.  REVIEW OF SYSTEMS:   Constitutional: Denies fevers, chills or abnormal weight loss Eyes: Denies blurriness of vision Ears, nose, mouth, throat, and face: Denies mucositis or sore throat Respiratory: Denies cough, dyspnea or wheezes Cardiovascular: Denies palpitation, chest discomfort Gastrointestinal:  Denies nausea, heartburn or change in bowel habits Skin: Denies abnormal skin rashes Lymphatics: Denies new  lymphadenopathy or easy bruising Neurological:Denies numbness, tingling or new weaknesses Behavioral/Psych: Mood is stable, no new changes  Extremities: No lower extremity edema Breast:  denies any pain or lumps or nodules in either breasts All other systems were reviewed with the patient and are negative.  I have reviewed the past medical history, past surgical history, social history and family history with the patient and they are unchanged from previous note.  ALLERGIES:  is allergic to ciprofloxacin; citalopram; codeine; maxzide [hydrochlorothiazide w-triamterene]; and septra ds [sulfamethoxazole-trimethoprim].  MEDICATIONS:  Current Outpatient Medications  Medication Sig Dispense Refill  . ALPRAZolam (XANAX) 0.5 MG tablet Take 0.5 mg by mouth 3 (three) times daily as needed. Anxiety.    Marland Kitchen amLODipine (NORVASC) 10 MG tablet TK 1 T PO QD FOR HIGH BP  2  . aspirin 81 MG tablet Take 81 mg by mouth daily.    Marland Kitchen glimepiride (AMARYL) 2 MG tablet Take 2 mg by mouth daily before breakfast.    . hydrochlorothiazide (HYDRODIURIL) 12.5 MG tablet hydrochlorothiazide 12.5 mg tablet  TK 1 T PO QD IN THE MORNING    . irbesartan (AVAPRO) 300 MG tablet TK 1 T PO QD  5  . metFORMIN (GLUCOPHAGE-XR) 500 MG 24 hr tablet 500 mg daily.    . pravastatin (PRAVACHOL) 20 MG tablet Take 20 mg by mouth daily.    . traMADol (ULTRAM) 50 MG tablet Take 1 tablet (50 mg total) by mouth every 6 (six) hours as needed. Fibromyalgia pain. 30 tablet 0   No current facility-administered medications for this visit.     PHYSICAL EXAMINATION: ECOG PERFORMANCE STATUS: 1 - Symptomatic but completely ambulatory  Vitals:   01/16/18 1412  BP: 135/68  Pulse: (!) 102  Resp: 17  Temp: 98.2 F (36.8 C)  SpO2: 97%   Filed Weights   01/16/18 1412  Weight: 150 lb 14.4 oz (68.4 kg)    GENERAL:alert, no distress and comfortable SKIN: skin color, texture, turgor are normal, no rashes or significant lesions EYES: normal,  Conjunctiva are pink and non-injected, sclera clear OROPHARYNX:no exudate, no erythema and lips, buccal mucosa, and tongue normal  NECK: supple, thyroid normal size, non-tender, without nodularity LYMPH:  no palpable lymphadenopathy in the cervical, axillary or inguinal LUNGS: clear to auscultation and percussion with normal breathing effort HEART: regular rate & rhythm and no murmurs and no lower extremity edema ABDOMEN:abdomen soft, non-tender and normal bowel sounds MUSCULOSKELETAL:no cyanosis of digits and no clubbing  NEURO: alert & oriented x 3 with fluent speech, no focal motor/sensory deficits EXTREMITIES: No lower extremity edema   LABORATORY DATA:  I have reviewed the data as listed CMP Latest Ref Rng & Units 04/03/2017 04/29/2012  Glucose 65 - 99 mg/dL 208(H) 142(H)  BUN 6 - 20 mg/dL 13 12  Creatinine 0.44 - 1.00 mg/dL 0.66 0.63  Sodium 135 - 145 mmol/L 136 137  Potassium 3.5 - 5.1 mmol/L 4.4 3.9  Chloride 101 - 111 mmol/L 101 98  CO2 22 - 32 mmol/L 25 26  Calcium 8.9 - 10.3 mg/dL 9.6 9.3  Total Protein 6.0 - 8.3 g/dL - 7.6  Total Bilirubin 0.3 - 1.2 mg/dL - 0.2(L)  Alkaline Phos 39 - 117 U/L - 68  AST 0 - 37 U/L - 20  ALT 0 - 35 U/L - 14    Lab Results  Component Value Date   WBC 9.5 05/20/2016   HGB 14.9 05/20/2016   HCT 43.2 05/20/2016   MCV 88.1 05/20/2016   PLT 290 04/29/2012   NEUTROABS 6.7 04/29/2012    ASSESSMENT & PLAN:  Malignant neoplasm of upper-outer quadrant of right breast in female, estrogen receptor positive (Christina [name]) 04/04/2017 right lumpectomy: IDC grade 1, 1.2 cm, margins negative, 0/4 lymph nodes negative, ER 95%, PR 60%, HER-2 negative ratio 1.29, Ki-67 2%, T1 cN0 stage I a  Pathology counseling: I discussed the final pathology report of the patient provided  a copy of this report. I discussed the margins as well as lymph node surgeries. We also discussed the final staging along with previously performed ER/PR and HER-2/neu  testing.  Recommendation: 1.  No role of systemic chemotherapy because of favorable prognostic panel as well as elderly age 28.  Adjuvant radiation was also not administered because of her favorable prognostic markers and advanced age. 3.  Adjuvant antiestrogen therapy with anastrozole started October 2018 discontinued April 2019 due to side effects  Anastrozole toxicities: Patient felt very depressed while she was taking anastrozole.  She also felt physically fatigued and weak.  She stopped it in April and since then she has been doing much better.  She does not want to take any other antiestrogen therapy. She will undergo mammogram evaluation in October 2019.  Return to clinic in 1 year for follow-up   No orders of the defined types were placed in this encounter.  The patient has a good understanding of the overall plan. she agrees with it. she will call with any problems that may develop before the next visit here.   Harriette Ohara, MD 01/16/18

## 2018-01-16 NOTE — Assessment & Plan Note (Signed)
04/04/2017 right lumpectomy: IDC grade 1, 1.2 cm, margins negative, 0/4 lymph nodes negative, ER 95%, PR 60%, HER-2 negative ratio 1.29, Ki-67 2%, T1 cN0 stage I a  Pathology counseling: I discussed the final pathology report of the patient provided  a copy of this report. I discussed the margins as well as lymph node surgeries. We also discussed the final staging along with previously performed ER/PR and HER-2/neu testing.  Recommendation: 1.  No role of systemic chemotherapy because of favorable prognostic panel as well as elderly age 16.  Adjuvant radiation was also not administered because of her favorable prognostic markers and advanced age. 3.  Adjuvant antiestrogen therapy with anastrozole started October 2018  Anastrozole toxicities:  Return to clinic in 1 year for follow-up

## 2018-01-17 ENCOUNTER — Telehealth: Payer: Self-pay | Admitting: Hematology and Oncology

## 2018-01-17 NOTE — Telephone Encounter (Signed)
Mailed calendar for next year appt.

## 2018-01-17 NOTE — Telephone Encounter (Signed)
Per 7/31 los.  Mailed calendar. For Aug 2020 appt.

## 2018-03-11 ENCOUNTER — Ambulatory Visit
Admission: RE | Admit: 2018-03-11 | Discharge: 2018-03-11 | Disposition: A | Discharge status: Home / Self Care | Payer: Medicare Other | Source: Ambulatory Visit | Attending: Adult Health | Admitting: Adult Health

## 2018-03-11 DIAGNOSIS — C50411 Malignant neoplasm of upper-outer quadrant of right female breast: Secondary | ICD-10-CM

## 2018-03-11 DIAGNOSIS — Z170 Estrogen receptor positive status [ER+]: Secondary | ICD-10-CM

## 2018-03-11 DIAGNOSIS — R922 Inconclusive mammogram: Secondary | ICD-10-CM | POA: Diagnosis not present

## 2018-04-13 DIAGNOSIS — H40033 Anatomical narrow angle, bilateral: Secondary | ICD-10-CM | POA: Diagnosis not present

## 2018-04-13 DIAGNOSIS — E119 Type 2 diabetes mellitus without complications: Secondary | ICD-10-CM | POA: Diagnosis not present

## 2018-04-22 DIAGNOSIS — E1165 Type 2 diabetes mellitus with hyperglycemia: Secondary | ICD-10-CM | POA: Diagnosis not present

## 2018-04-22 DIAGNOSIS — R300 Dysuria: Secondary | ICD-10-CM | POA: Diagnosis not present

## 2018-04-22 DIAGNOSIS — F419 Anxiety disorder, unspecified: Secondary | ICD-10-CM | POA: Diagnosis not present

## 2018-04-22 DIAGNOSIS — I10 Essential (primary) hypertension: Secondary | ICD-10-CM | POA: Diagnosis not present

## 2018-04-22 DIAGNOSIS — E782 Mixed hyperlipidemia: Secondary | ICD-10-CM | POA: Diagnosis not present

## 2018-07-18 DIAGNOSIS — N760 Acute vaginitis: Secondary | ICD-10-CM | POA: Diagnosis not present

## 2018-07-18 DIAGNOSIS — N814 Uterovaginal prolapse, unspecified: Secondary | ICD-10-CM | POA: Diagnosis not present

## 2018-07-18 DIAGNOSIS — N905 Atrophy of vulva: Secondary | ICD-10-CM | POA: Diagnosis not present

## 2018-07-23 DIAGNOSIS — F324 Major depressive disorder, single episode, in partial remission: Secondary | ICD-10-CM | POA: Diagnosis not present

## 2018-07-23 DIAGNOSIS — I10 Essential (primary) hypertension: Secondary | ICD-10-CM | POA: Diagnosis not present

## 2018-07-23 DIAGNOSIS — E1142 Type 2 diabetes mellitus with diabetic polyneuropathy: Secondary | ICD-10-CM | POA: Diagnosis not present

## 2018-07-23 DIAGNOSIS — F419 Anxiety disorder, unspecified: Secondary | ICD-10-CM | POA: Diagnosis not present

## 2018-07-25 IMAGING — US ULTRASOUND RIGHT BREAST LIMITED
1 series · 9 of 9 positions shown · non-contrast
Comparison: Previous exam(s).

CLINICAL DATA: Screening recall for a right breast asymmetry.

EXAM:
2D DIGITAL DIAGNOSTIC UNILATERAL RIGHT MAMMOGRAM WITH CAD AND
ADJUNCT TOMO
RIGHT BREAST ULTRASOUND

[Series 1: ultrasound right breast limited · 0.06mm/px · 9 of 9 slices shown]
[im 1/9]
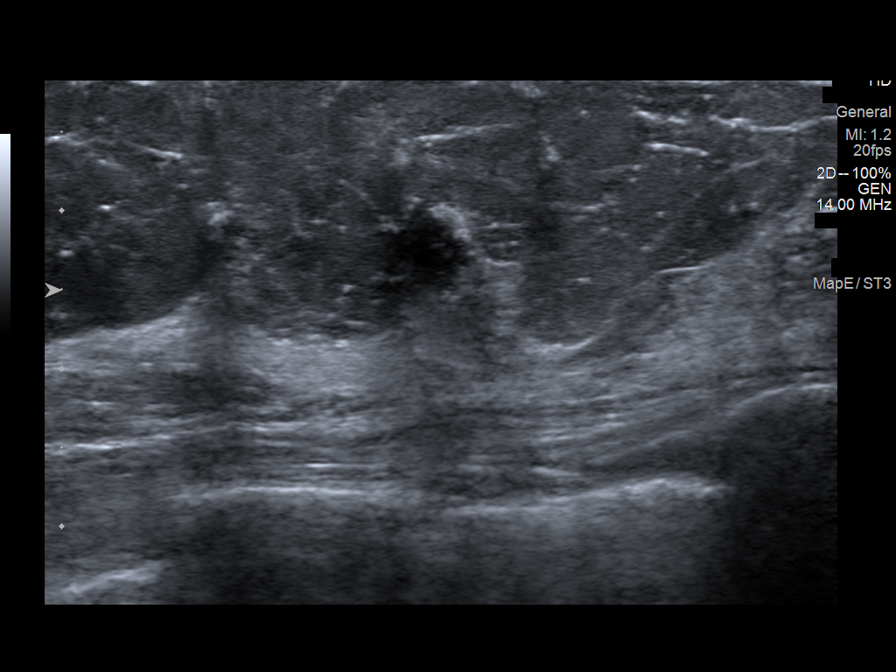
[im 2/9]
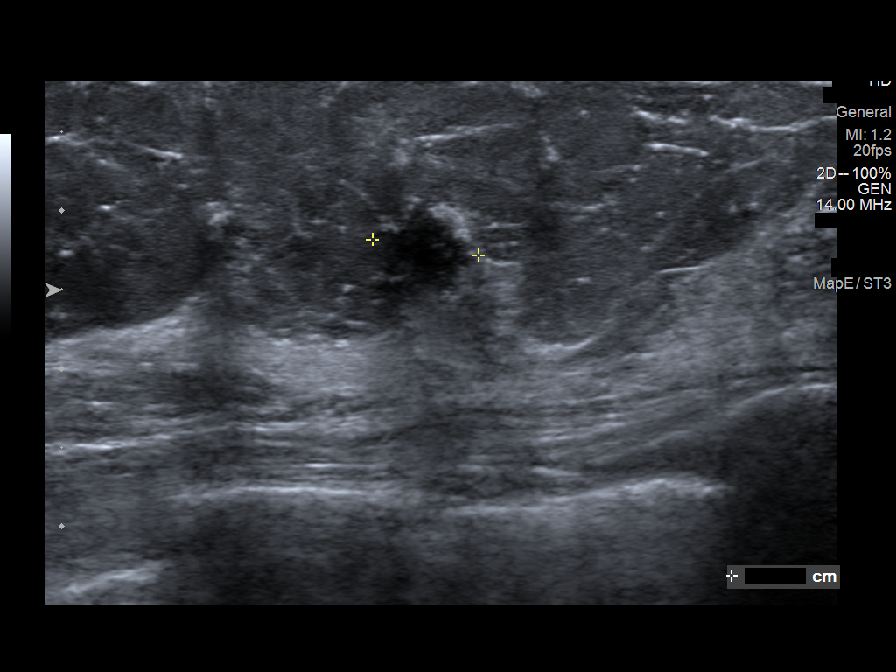
[im 3/9]
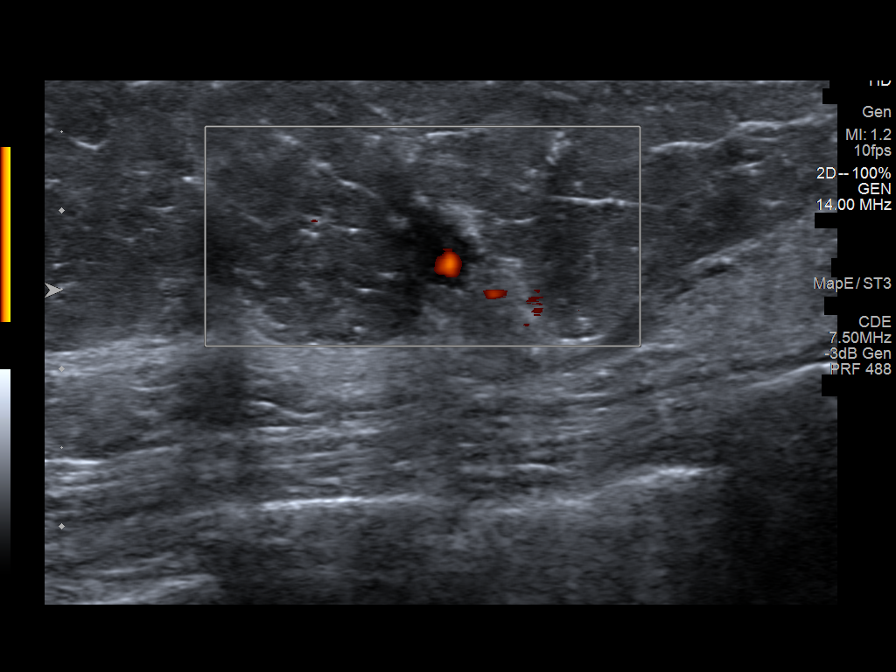
[im 4/9]
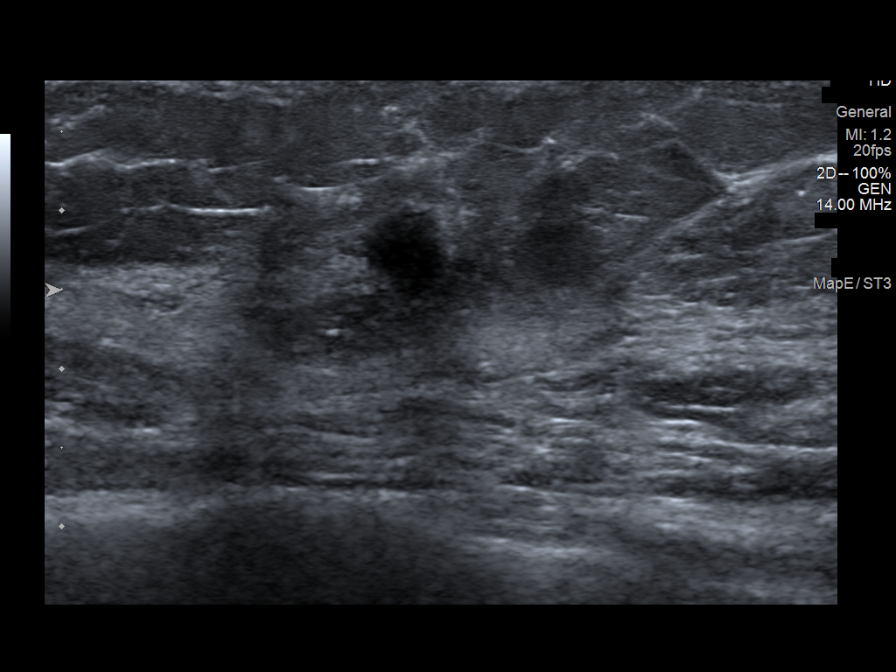
[im 5/9]
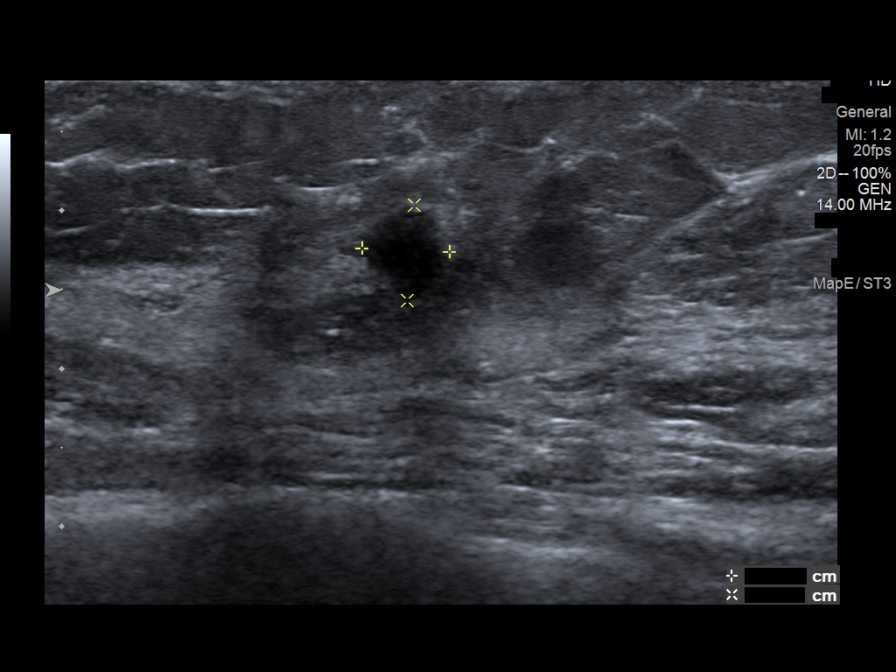
[im 6/9]
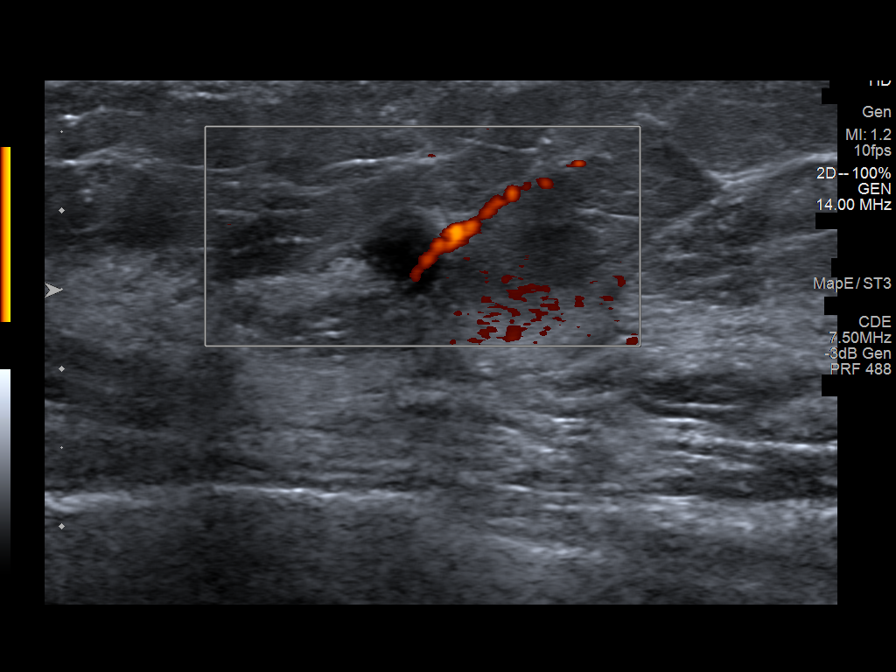
[im 7/9]
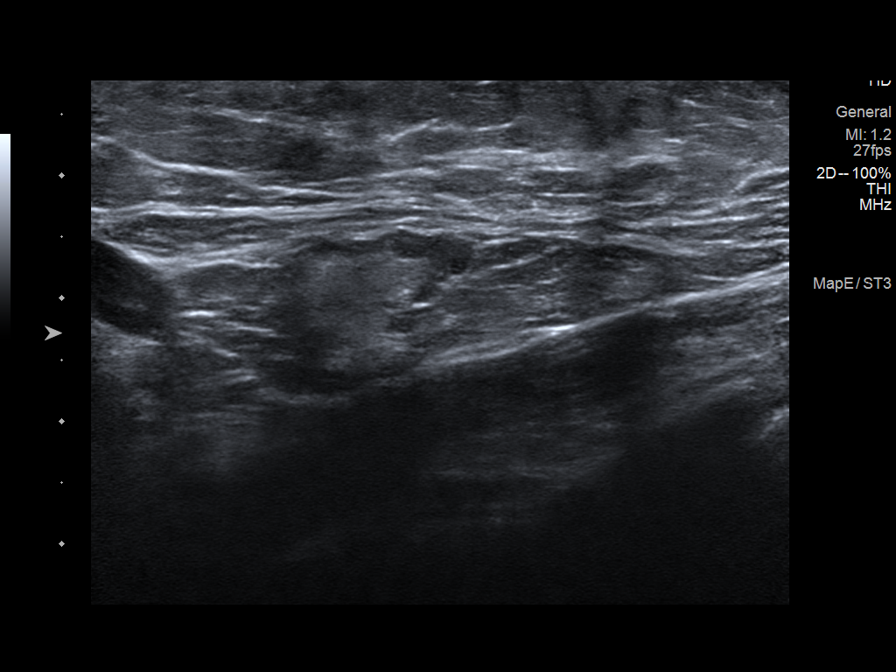
[im 8/9]
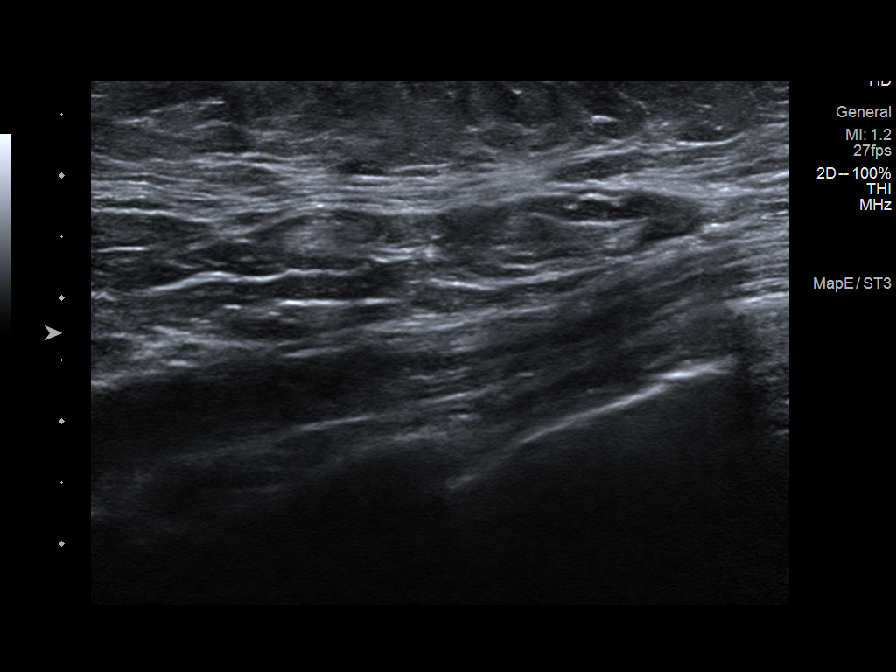
[im 9/9]
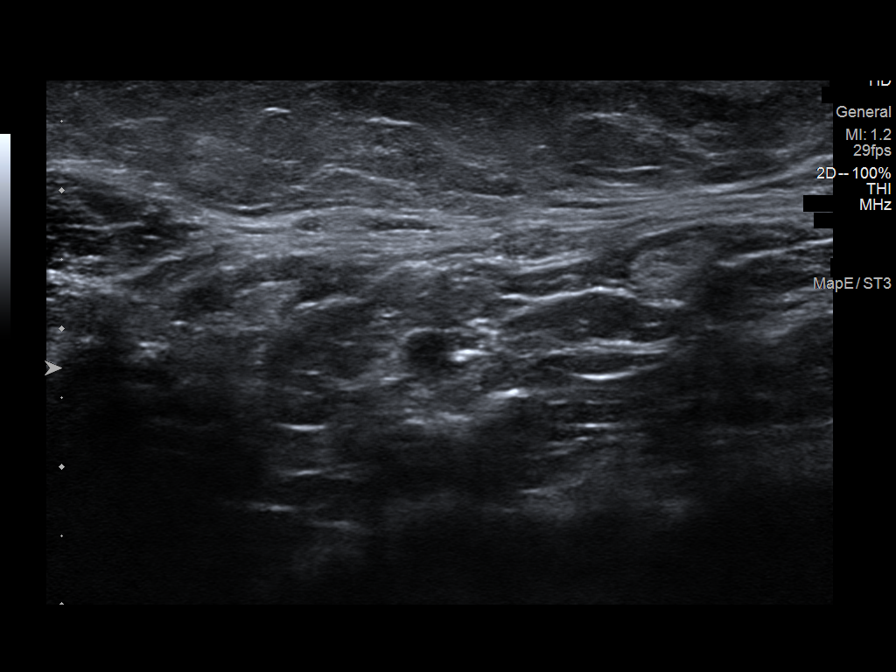

[9 of 9 positions shown; findings below may reference images not displayed]

ACR Breast Density Category b: There are scattered areas of
fibroglandular density.
FINDINGS: Spot compression tomosynthesis images of the upper-outer quadrant of
the right breast demonstrates a persistent asymmetry with a very
subtle associated appearance of distortion. The mass measures
approximately 7 mm.

Mammographic images were processed with CAD.

Ultrasound targeted to the right breast at [DATE], 3 cm from the
nipple demonstrates an irregular hypoechoic vascular mass with
indistinct margins measuring 7 x 6 x 6 mm. Ultrasound of the right
axilla demonstrates multiple normal-appearing lymph nodes.
IMPRESSION: 1. There is a persistent suspicious mass in the right breast at 930.

2.  No evidence of right axillary lymphadenopathy.

RECOMMENDATION:
Ultrasound-guided biopsy is recommended for the right breast mass at
[DATE]. This has been scheduled for 03/21/2017 at [DATE] a.m..

I have discussed the findings and recommendations with the patient.
Results were also provided in writing at the conclusion of the
visit. If applicable, a reminder letter will be sent to the patient
regarding the next appointment.

BI-RADS CATEGORY  5: Highly suggestive of malignancy.

## 2018-08-12 IMAGING — MG MM CLIP PLACEMENT
2 series · 2 of 2 positions shown · non-contrast
Comparison: Previous exam(s).

CLINICAL DATA: Patient status post radioactive seed placement for
preoperative localization right breast.

EXAM:
DIAGNOSTIC RIGHT MAMMOGRAM POST ULTRASOUND-GUIDED RADIOACTIVE SEED
PLACEMENT

[R ML]
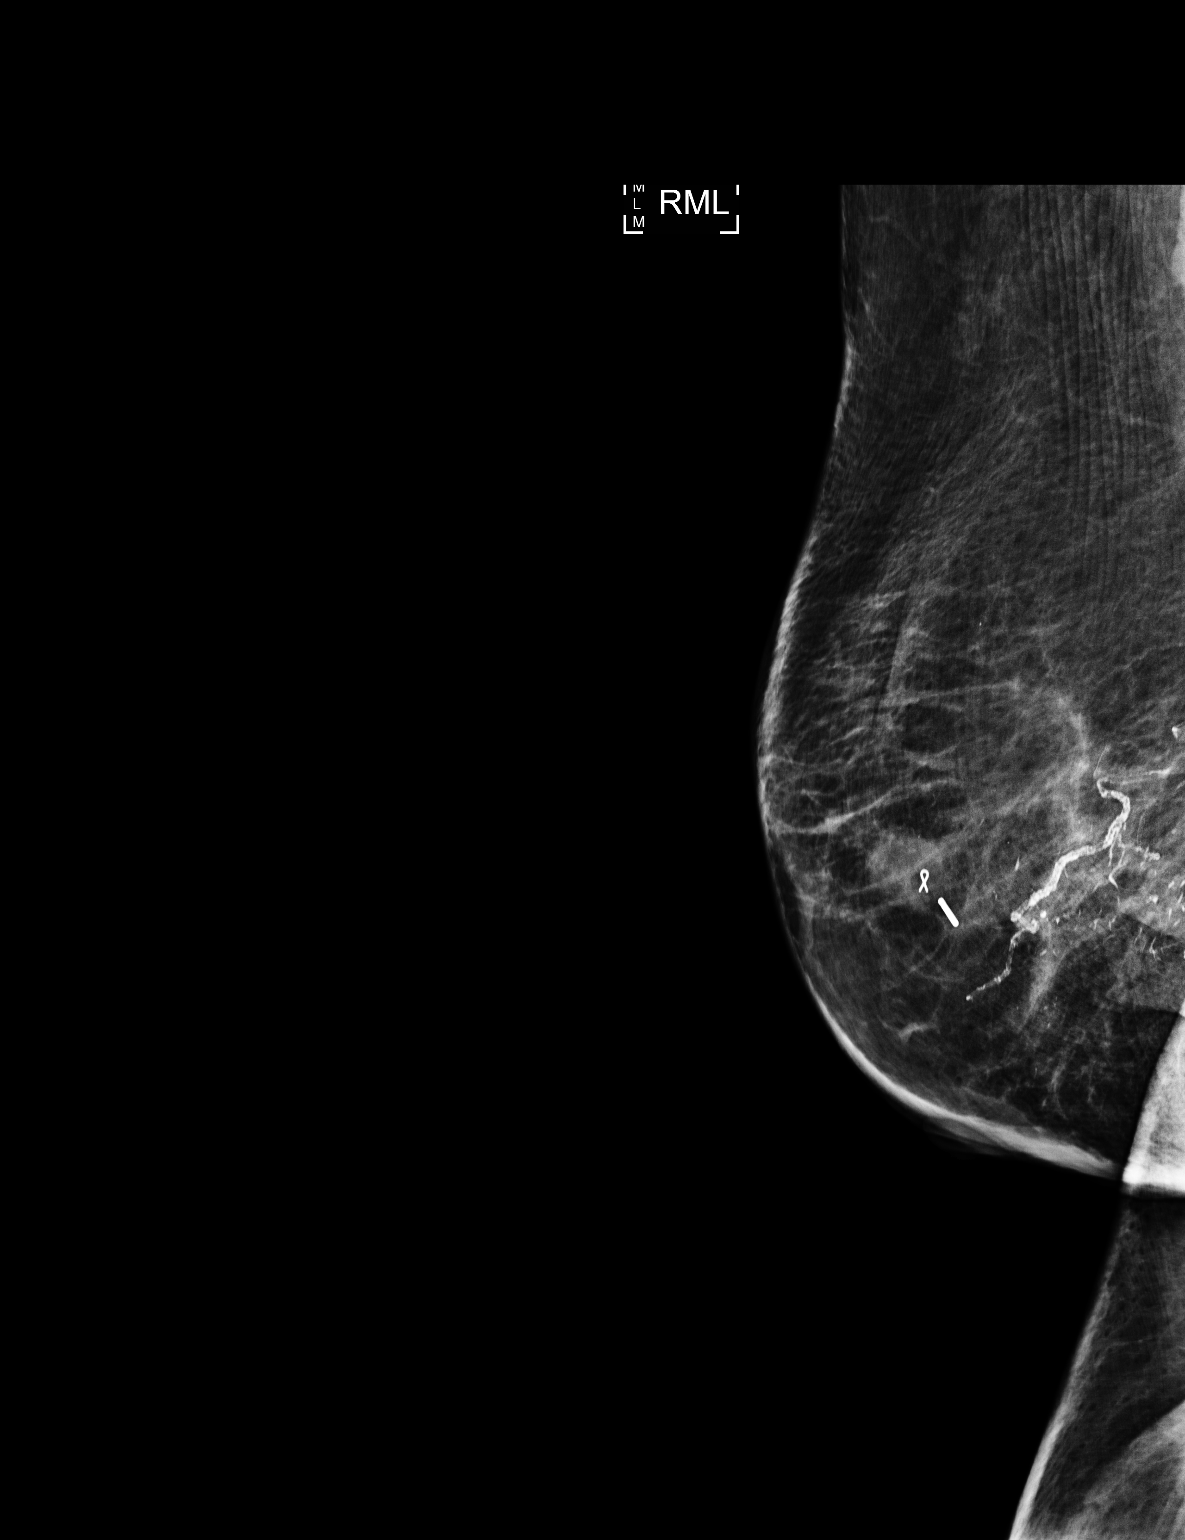

[R CC]
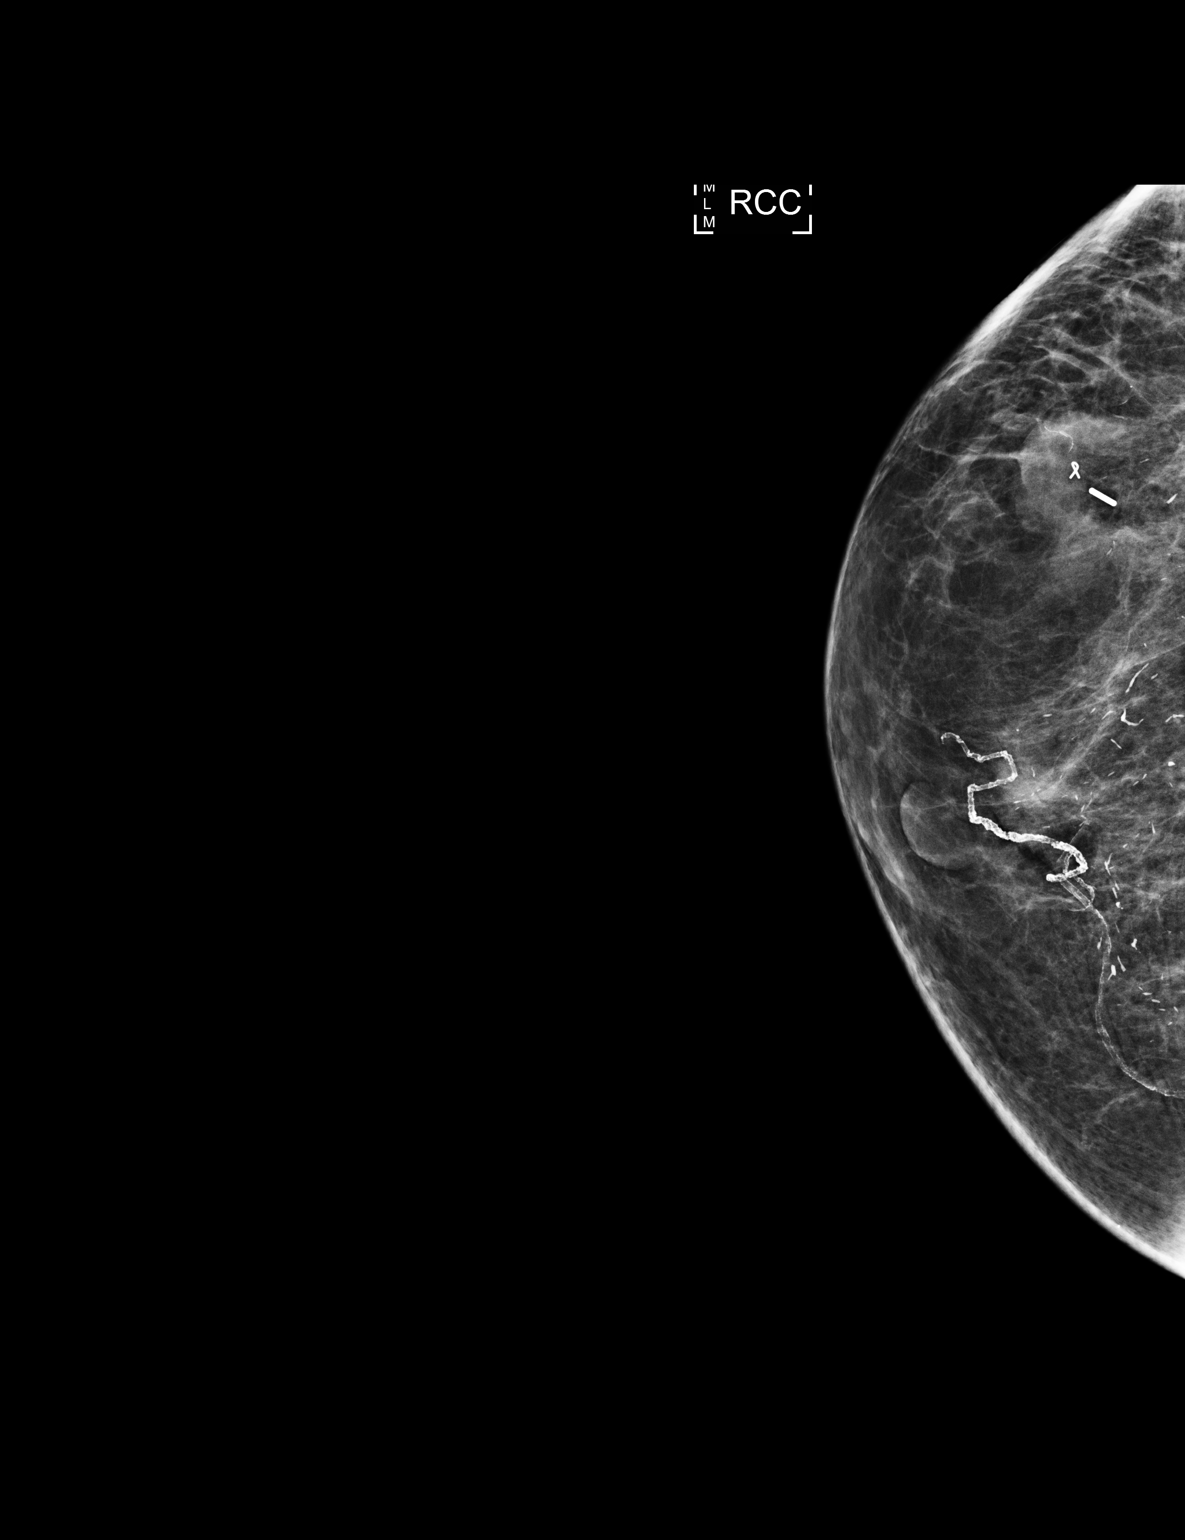

[2 of 2 positions shown; findings below may reference images not displayed]

FINDINGS: Mammographic images were obtained following ultrasound-guided
radioactive seed placement. These demonstrate the radioactive seed
to be located adjacent to the ribbon shaped marking clip within the
lateral right breast..
IMPRESSION: Appropriate location of the radioactive seed.

Final Assessment: Post Procedure Mammograms for Seed Placement

## 2018-08-22 DIAGNOSIS — N952 Postmenopausal atrophic vaginitis: Secondary | ICD-10-CM | POA: Diagnosis not present

## 2018-10-25 DIAGNOSIS — L900 Lichen sclerosus et atrophicus: Secondary | ICD-10-CM | POA: Diagnosis not present

## 2018-10-25 DIAGNOSIS — N898 Other specified noninflammatory disorders of vagina: Secondary | ICD-10-CM | POA: Diagnosis not present

## 2018-11-06 DIAGNOSIS — E1165 Type 2 diabetes mellitus with hyperglycemia: Secondary | ICD-10-CM | POA: Diagnosis not present

## 2018-11-06 DIAGNOSIS — L900 Lichen sclerosus et atrophicus: Secondary | ICD-10-CM | POA: Diagnosis not present

## 2018-11-06 DIAGNOSIS — N9089 Other specified noninflammatory disorders of vulva and perineum: Secondary | ICD-10-CM | POA: Diagnosis not present

## 2019-01-15 ENCOUNTER — Telehealth: Payer: Self-pay | Admitting: Hematology and Oncology

## 2019-01-15 NOTE — Telephone Encounter (Signed)
I talk with patient regarding phone visit  °

## 2019-01-15 NOTE — Assessment & Plan Note (Signed)
04/04/2017 right lumpectomy: IDC grade 1, 1.2 cm, margins negative, 0/4 lymph nodes negative, ER 95%, PR 60%, HER-2 negative ratio 1.29, Ki-67 2%, T1 cN0 stage I a  Recommendation: 1.No role of systemic chemotherapy because of favorable prognostic panel as well as elderly age 78.  Adjuvant radiation was also not administered because of her favorable prognostic markers and advanced age. 3.Adjuvant antiestrogen therapy with anastrozole started October 2018 discontinued April 2019 due to side effects  Anastrozole toxicities: Patient felt very depressed while she was taking anastrozole.  She also felt physically fatigued and weak.  She stopped it in April and since then she has been doing much better.  She does not want to take any other antiestrogen therapy.  Breast cancer surveillance: Mammogram 03/11/2018: Benign breast density category C Return to clinic in 1 year for follow-up

## 2019-01-21 ENCOUNTER — Telehealth: Payer: Self-pay | Admitting: Hematology and Oncology

## 2019-01-21 NOTE — Progress Notes (Signed)
  HEMATOLOGY-ONCOLOGY TELEPHONE VISIT PROGRESS NOTE  I connected with [name] on 01/22/2019 at  2:00 PM EDT by telephone and verified that I am speaking with the correct person using two identifiers.  I discussed the limitations, risks, security and privacy concerns of performing an evaluation and management service by telephone and the availability of in person appointments.  I also discussed with the patient that there may be a patient responsible charge related to this service. The patient expressed understanding and agreed to proceed.   History of Present Illness: Christina [name] is a 78 y.o. female with above-mentioned history of right breast cancer who underwent a lumpectomy and discontinued anti-estrogen therapy with anastrozole due to side effects. She is currently on surveillance. I last saw her a year ago. Mammogram on 03/11/18 showed no evidence of malignancy bilaterally. She presents over the phone today for annual follow-up.   Oncology History  Malignant neoplasm of upper-outer quadrant of right breast in female, estrogen receptor positive ([place])  03/21/2017 Initial Diagnosis   Malignant neoplasm of upper-outer quadrant of right breast in female, estrogen receptor positive ([place])   04/04/2017 Surgery   Right lumpectomy: IDC grade 1, 1.2 cm, margins negative, 0/4 lymph nodes negative, ER 95%, PR 60%, HER-2 negative ratio 1.29, Ki-67 2%, T1 cN0 stage I a   03/2017 - 09/2017 Anti-estrogen oral therapy   Anastrozole daily     Observations/Objective:  No evidence of recurrence of breast cancer   Assessment Plan:  Malignant neoplasm of upper-outer quadrant of right breast in female, estrogen receptor positive ([place]) 04/04/2017 right lumpectomy: IDC grade 1, 1.2 cm, margins negative, 0/4 lymph nodes negative, ER 95%, PR 60%, HER-2 negative ratio 1.29, Ki-67 2%, T1 cN0 stage I a  Recommendation: 1.No role of systemic chemotherapy because of favorable prognostic panel as well as  elderly age 78.  Adjuvant radiation was also not administered because of her favorable prognostic markers and advanced age. 3.Adjuvant antiestrogen therapy with anastrozole started October 2018 discontinued April 2019 due to side effects  Breast cancer surveillance: Mammogram 03/11/2018: Benign breast density category C Will try to exercise  Return to clinic in 1 year for follow-up  I discussed the assessment and treatment plan with the patient. The patient was provided an opportunity to ask questions and all were answered. The patient agreed with the plan and demonstrated an understanding of the instructions. The patient was advised to call back or seek an in-person evaluation if the symptoms worsen or if the condition fails to improve as anticipated.   I provided 15 minutes of non-face-to-face time during this encounter.   Rulon Eisenmenger, MD 01/22/2019    I, Molly Dorshimer, am acting as scribe for Nicholas Lose, MD.  I have reviewed the above documentation for accuracy and completeness, and I agree with the above.

## 2019-01-22 ENCOUNTER — Inpatient Hospital Stay: Payer: Medicare Other | Attending: Hematology and Oncology | Admitting: Hematology and Oncology

## 2019-01-22 DIAGNOSIS — Z79811 Long term (current) use of aromatase inhibitors: Secondary | ICD-10-CM | POA: Diagnosis not present

## 2019-01-22 DIAGNOSIS — C50411 Malignant neoplasm of upper-outer quadrant of right female breast: Secondary | ICD-10-CM | POA: Diagnosis not present

## 2019-01-22 DIAGNOSIS — Z170 Estrogen receptor positive status [ER+]: Secondary | ICD-10-CM

## 2019-01-23 ENCOUNTER — Telehealth: Payer: Self-pay | Admitting: Hematology and Oncology

## 2019-01-23 NOTE — Telephone Encounter (Signed)
I talk with patient regarding schedule  

## 2019-03-18 DIAGNOSIS — E782 Mixed hyperlipidemia: Secondary | ICD-10-CM | POA: Diagnosis not present

## 2019-03-18 DIAGNOSIS — E1165 Type 2 diabetes mellitus with hyperglycemia: Secondary | ICD-10-CM | POA: Diagnosis not present

## 2019-03-18 DIAGNOSIS — M189 Osteoarthritis of first carpometacarpal joint, unspecified: Secondary | ICD-10-CM | POA: Diagnosis not present

## 2019-03-18 DIAGNOSIS — I10 Essential (primary) hypertension: Secondary | ICD-10-CM | POA: Diagnosis not present

## 2019-04-14 DIAGNOSIS — E782 Mixed hyperlipidemia: Secondary | ICD-10-CM | POA: Diagnosis not present

## 2019-04-14 DIAGNOSIS — I10 Essential (primary) hypertension: Secondary | ICD-10-CM | POA: Diagnosis not present

## 2019-04-14 DIAGNOSIS — E1142 Type 2 diabetes mellitus with diabetic polyneuropathy: Secondary | ICD-10-CM | POA: Diagnosis not present

## 2019-04-14 DIAGNOSIS — K219 Gastro-esophageal reflux disease without esophagitis: Secondary | ICD-10-CM | POA: Diagnosis not present

## 2019-04-17 DIAGNOSIS — M189 Osteoarthritis of first carpometacarpal joint, unspecified: Secondary | ICD-10-CM | POA: Diagnosis not present

## 2019-04-17 DIAGNOSIS — I10 Essential (primary) hypertension: Secondary | ICD-10-CM | POA: Diagnosis not present

## 2019-04-17 DIAGNOSIS — E119 Type 2 diabetes mellitus without complications: Secondary | ICD-10-CM | POA: Diagnosis not present

## 2019-04-17 DIAGNOSIS — E782 Mixed hyperlipidemia: Secondary | ICD-10-CM | POA: Diagnosis not present

## 2019-05-08 DIAGNOSIS — E1142 Type 2 diabetes mellitus with diabetic polyneuropathy: Secondary | ICD-10-CM | POA: Diagnosis not present

## 2019-05-08 DIAGNOSIS — I10 Essential (primary) hypertension: Secondary | ICD-10-CM | POA: Diagnosis not present

## 2019-05-08 DIAGNOSIS — E782 Mixed hyperlipidemia: Secondary | ICD-10-CM | POA: Diagnosis not present

## 2019-05-08 DIAGNOSIS — M189 Osteoarthritis of first carpometacarpal joint, unspecified: Secondary | ICD-10-CM | POA: Diagnosis not present

## 2019-05-14 DIAGNOSIS — Z1231 Encounter for screening mammogram for malignant neoplasm of breast: Secondary | ICD-10-CM | POA: Diagnosis not present

## 2019-06-06 DIAGNOSIS — I10 Essential (primary) hypertension: Secondary | ICD-10-CM | POA: Diagnosis not present

## 2019-06-06 DIAGNOSIS — E782 Mixed hyperlipidemia: Secondary | ICD-10-CM | POA: Diagnosis not present

## 2019-06-06 DIAGNOSIS — M189 Osteoarthritis of first carpometacarpal joint, unspecified: Secondary | ICD-10-CM | POA: Diagnosis not present

## 2019-06-06 DIAGNOSIS — E119 Type 2 diabetes mellitus without complications: Secondary | ICD-10-CM | POA: Diagnosis not present

## 2019-07-07 DIAGNOSIS — E119 Type 2 diabetes mellitus without complications: Secondary | ICD-10-CM | POA: Diagnosis not present

## 2019-07-07 DIAGNOSIS — E782 Mixed hyperlipidemia: Secondary | ICD-10-CM | POA: Diagnosis not present

## 2019-07-07 DIAGNOSIS — I10 Essential (primary) hypertension: Secondary | ICD-10-CM | POA: Diagnosis not present

## 2019-07-07 DIAGNOSIS — M189 Osteoarthritis of first carpometacarpal joint, unspecified: Secondary | ICD-10-CM | POA: Diagnosis not present

## 2019-07-19 ENCOUNTER — Ambulatory Visit: Payer: Medicare Other

## 2019-07-24 ENCOUNTER — Ambulatory Visit: Payer: Medicare Other

## 2019-08-12 DIAGNOSIS — I10 Essential (primary) hypertension: Secondary | ICD-10-CM | POA: Diagnosis not present

## 2019-08-12 DIAGNOSIS — Z853 Personal history of malignant neoplasm of breast: Secondary | ICD-10-CM | POA: Diagnosis not present

## 2019-08-12 DIAGNOSIS — E782 Mixed hyperlipidemia: Secondary | ICD-10-CM | POA: Diagnosis not present

## 2019-08-12 DIAGNOSIS — E119 Type 2 diabetes mellitus without complications: Secondary | ICD-10-CM | POA: Diagnosis not present

## 2019-08-14 DIAGNOSIS — E782 Mixed hyperlipidemia: Secondary | ICD-10-CM | POA: Diagnosis not present

## 2019-08-14 DIAGNOSIS — I10 Essential (primary) hypertension: Secondary | ICD-10-CM | POA: Diagnosis not present

## 2019-08-14 DIAGNOSIS — J0100 Acute maxillary sinusitis, unspecified: Secondary | ICD-10-CM | POA: Diagnosis not present

## 2019-08-14 DIAGNOSIS — E1142 Type 2 diabetes mellitus with diabetic polyneuropathy: Secondary | ICD-10-CM | POA: Diagnosis not present

## 2019-09-09 DIAGNOSIS — E119 Type 2 diabetes mellitus without complications: Secondary | ICD-10-CM | POA: Diagnosis not present

## 2019-09-09 DIAGNOSIS — E782 Mixed hyperlipidemia: Secondary | ICD-10-CM | POA: Diagnosis not present

## 2019-09-09 DIAGNOSIS — I10 Essential (primary) hypertension: Secondary | ICD-10-CM | POA: Diagnosis not present

## 2019-09-09 DIAGNOSIS — Z853 Personal history of malignant neoplasm of breast: Secondary | ICD-10-CM | POA: Diagnosis not present

## 2019-09-30 DIAGNOSIS — E1165 Type 2 diabetes mellitus with hyperglycemia: Secondary | ICD-10-CM | POA: Diagnosis not present

## 2019-09-30 DIAGNOSIS — Z7984 Long term (current) use of oral hypoglycemic drugs: Secondary | ICD-10-CM | POA: Diagnosis not present

## 2019-10-10 DIAGNOSIS — E782 Mixed hyperlipidemia: Secondary | ICD-10-CM | POA: Diagnosis not present

## 2019-10-10 DIAGNOSIS — E119 Type 2 diabetes mellitus without complications: Secondary | ICD-10-CM | POA: Diagnosis not present

## 2019-10-10 DIAGNOSIS — I10 Essential (primary) hypertension: Secondary | ICD-10-CM | POA: Diagnosis not present

## 2019-10-10 DIAGNOSIS — Z853 Personal history of malignant neoplasm of breast: Secondary | ICD-10-CM | POA: Diagnosis not present

## 2019-11-06 DIAGNOSIS — I10 Essential (primary) hypertension: Secondary | ICD-10-CM | POA: Diagnosis not present

## 2019-11-06 DIAGNOSIS — Z853 Personal history of malignant neoplasm of breast: Secondary | ICD-10-CM | POA: Diagnosis not present

## 2019-11-06 DIAGNOSIS — E782 Mixed hyperlipidemia: Secondary | ICD-10-CM | POA: Diagnosis not present

## 2019-11-06 DIAGNOSIS — E119 Type 2 diabetes mellitus without complications: Secondary | ICD-10-CM | POA: Diagnosis not present

## 2019-12-08 DIAGNOSIS — E119 Type 2 diabetes mellitus without complications: Secondary | ICD-10-CM | POA: Diagnosis not present

## 2019-12-08 DIAGNOSIS — M189 Osteoarthritis of first carpometacarpal joint, unspecified: Secondary | ICD-10-CM | POA: Diagnosis not present

## 2019-12-08 DIAGNOSIS — E782 Mixed hyperlipidemia: Secondary | ICD-10-CM | POA: Diagnosis not present

## 2019-12-08 DIAGNOSIS — I10 Essential (primary) hypertension: Secondary | ICD-10-CM | POA: Diagnosis not present

## 2020-01-07 DIAGNOSIS — E782 Mixed hyperlipidemia: Secondary | ICD-10-CM | POA: Diagnosis not present

## 2020-01-07 DIAGNOSIS — I10 Essential (primary) hypertension: Secondary | ICD-10-CM | POA: Diagnosis not present

## 2020-01-07 DIAGNOSIS — M189 Osteoarthritis of first carpometacarpal joint, unspecified: Secondary | ICD-10-CM | POA: Diagnosis not present

## 2020-01-07 DIAGNOSIS — E119 Type 2 diabetes mellitus without complications: Secondary | ICD-10-CM | POA: Diagnosis not present

## 2020-01-22 ENCOUNTER — Inpatient Hospital Stay: Payer: Medicare Other | Admitting: Hematology and Oncology

## 2020-01-28 ENCOUNTER — Telehealth: Payer: Self-pay | Admitting: Hematology and Oncology

## 2020-01-28 NOTE — Progress Notes (Signed)
  HEMATOLOGY-ONCOLOGY TELEPHONE VISIT PROGRESS NOTE  I connected with Christina [name] on 01/29/2020 at  3:15 PM EDT by telephone and verified that I am speaking with the correct person using two identifiers.  I discussed the limitations, risks, security and privacy concerns of performing an evaluation and management service by telephone and the availability of in person appointments.  I also discussed with the patient that there may be a patient responsible charge related to this service. The patient expressed understanding and agreed to proceed.   History of Present Illness: Christina [name] is a 79 y.o. female with above-mentioned history of right breast cancer who underwent a lumpectomy and discontinued anti-estrogen therapy with anastrozole due to side effects. She is currently on surveillance. She presents over the phone today for annual follow-up.   Oncology History  Malignant neoplasm of upper-outer quadrant of right breast in female, estrogen receptor positive ([place])  03/21/2017 Initial Diagnosis   Malignant neoplasm of upper-outer quadrant of right breast in female, estrogen receptor positive ([place])   04/04/2017 Surgery   Right lumpectomy: IDC grade 1, 1.2 cm, margins negative, 0/4 lymph nodes negative, ER 95%, PR 60%, HER-2 negative ratio 1.29, Ki-67 2%, T1 cN0 stage I a   03/2017 - 09/2017 Anti-estrogen oral therapy   Anastrozole daily     Observations/Objective: Temp 97, BP 121/68, PR 91, Sugar: 120 Wt 139 Lb Received Moderna Covid 19 vaccine April 6th   Assessment Plan:  Malignant neoplasm of upper-outer quadrant of right breast in female, estrogen receptor positive ([place]) 04/04/2017 right lumpectomy: IDC grade 1, 1.2 cm, margins negative, 0/4 lymph nodes negative, ER 95%, PR 60%, HER-2 negative ratio 1.29, Ki-67 2%, T1 cN0 stage I a  Recommendation: 1.No role of systemic chemotherapy because of favorable prognostic panel as well as elderly age 79. Adjuvant radiation was also  not administered because of her favorable prognostic markers and advanced age. 3.Adjuvant antiestrogen therapywith anastrozole started October 2018discontinued April 2019 due to side effects  Breast cancer surveillance:  Mammogram Oct 2020 at [facility]: Benign breast density category C  Return to clinic in 1 year for surveillance and follow-up  I discussed the assessment and treatment plan with the patient. The patient was provided an opportunity to ask questions and all were answered. The patient agreed with the plan and demonstrated an understanding of the instructions. The patient was advised to call back or seek an in-person evaluation if the symptoms worsen or if the condition fails to improve as anticipated.   I provided 15 minutes of non-face-to-face time during this encounter.   Rulon Eisenmenger, MD 01/29/2020    I, Cloyde Reams Dorshimer, am acting as scribe for Nicholas Lose, MD.  I have reviewed the above documentation for accuracy and completeness, and I agree with the above.

## 2020-01-29 ENCOUNTER — Inpatient Hospital Stay: Payer: Medicare Other | Attending: Hematology and Oncology | Admitting: Hematology and Oncology

## 2020-01-29 DIAGNOSIS — C50411 Malignant neoplasm of upper-outer quadrant of right female breast: Secondary | ICD-10-CM | POA: Diagnosis not present

## 2020-01-29 DIAGNOSIS — Z170 Estrogen receptor positive status [ER+]: Secondary | ICD-10-CM

## 2020-01-29 NOTE — Assessment & Plan Note (Signed)
04/04/2017 right lumpectomy: IDC grade 1, 1.2 cm, margins negative, 0/4 lymph nodes negative, ER 95%, PR 60%, HER-2 negative ratio 1.29, Ki-67 2%, T1 cN0 stage I a  Recommendation: 1.No role of systemic chemotherapy because of favorable prognostic panel as well as elderly age 79. Adjuvant radiation was also not administered because of her favorable prognostic markers and advanced age. 3.Adjuvant antiestrogen therapywith anastrozole started October 2018discontinued April 2019 due to side effects  Breast cancer surveillance:  Mammogram 03/11/2018: Benign breast density category C Breast exam 01/29/2020: Benign  Return to clinic in 1 year for surveillance and follow-up  Return to clinic in 1 year for follow-up

## 2020-02-02 ENCOUNTER — Telehealth: Payer: Self-pay | Admitting: Hematology and Oncology

## 2020-02-02 NOTE — Telephone Encounter (Signed)
Scheduled per 8/12 los. Called and spoke with pt, confirmed 8/12 appt

## 2020-02-12 DIAGNOSIS — E782 Mixed hyperlipidemia: Secondary | ICD-10-CM | POA: Diagnosis not present

## 2020-02-12 DIAGNOSIS — E119 Type 2 diabetes mellitus without complications: Secondary | ICD-10-CM | POA: Diagnosis not present

## 2020-02-12 DIAGNOSIS — M189 Osteoarthritis of first carpometacarpal joint, unspecified: Secondary | ICD-10-CM | POA: Diagnosis not present

## 2020-02-12 DIAGNOSIS — I10 Essential (primary) hypertension: Secondary | ICD-10-CM | POA: Diagnosis not present

## 2020-03-15 DIAGNOSIS — M189 Osteoarthritis of first carpometacarpal joint, unspecified: Secondary | ICD-10-CM | POA: Diagnosis not present

## 2020-03-15 DIAGNOSIS — E119 Type 2 diabetes mellitus without complications: Secondary | ICD-10-CM | POA: Diagnosis not present

## 2020-03-15 DIAGNOSIS — E782 Mixed hyperlipidemia: Secondary | ICD-10-CM | POA: Diagnosis not present

## 2020-03-15 DIAGNOSIS — I10 Essential (primary) hypertension: Secondary | ICD-10-CM | POA: Diagnosis not present

## 2020-03-18 DIAGNOSIS — F419 Anxiety disorder, unspecified: Secondary | ICD-10-CM | POA: Diagnosis not present

## 2020-03-18 DIAGNOSIS — I10 Essential (primary) hypertension: Secondary | ICD-10-CM | POA: Diagnosis not present

## 2020-03-18 DIAGNOSIS — E1142 Type 2 diabetes mellitus with diabetic polyneuropathy: Secondary | ICD-10-CM | POA: Diagnosis not present

## 2020-03-18 DIAGNOSIS — E782 Mixed hyperlipidemia: Secondary | ICD-10-CM | POA: Diagnosis not present

## 2020-03-18 DIAGNOSIS — N3001 Acute cystitis with hematuria: Secondary | ICD-10-CM | POA: Diagnosis not present

## 2020-04-07 DIAGNOSIS — E782 Mixed hyperlipidemia: Secondary | ICD-10-CM | POA: Diagnosis not present

## 2020-04-07 DIAGNOSIS — M189 Osteoarthritis of first carpometacarpal joint, unspecified: Secondary | ICD-10-CM | POA: Diagnosis not present

## 2020-04-07 DIAGNOSIS — I10 Essential (primary) hypertension: Secondary | ICD-10-CM | POA: Diagnosis not present

## 2020-04-07 DIAGNOSIS — E119 Type 2 diabetes mellitus without complications: Secondary | ICD-10-CM | POA: Diagnosis not present

## 2020-04-30 DIAGNOSIS — I10 Essential (primary) hypertension: Secondary | ICD-10-CM | POA: Diagnosis not present

## 2020-04-30 DIAGNOSIS — M189 Osteoarthritis of first carpometacarpal joint, unspecified: Secondary | ICD-10-CM | POA: Diagnosis not present

## 2020-04-30 DIAGNOSIS — E782 Mixed hyperlipidemia: Secondary | ICD-10-CM | POA: Diagnosis not present

## 2020-04-30 DIAGNOSIS — E119 Type 2 diabetes mellitus without complications: Secondary | ICD-10-CM | POA: Diagnosis not present

## 2020-06-02 DIAGNOSIS — E119 Type 2 diabetes mellitus without complications: Secondary | ICD-10-CM | POA: Diagnosis not present

## 2020-06-02 DIAGNOSIS — E782 Mixed hyperlipidemia: Secondary | ICD-10-CM | POA: Diagnosis not present

## 2020-06-02 DIAGNOSIS — I10 Essential (primary) hypertension: Secondary | ICD-10-CM | POA: Diagnosis not present

## 2020-06-02 DIAGNOSIS — C50411 Malignant neoplasm of upper-outer quadrant of right female breast: Secondary | ICD-10-CM | POA: Diagnosis not present

## 2020-07-02 DIAGNOSIS — E782 Mixed hyperlipidemia: Secondary | ICD-10-CM | POA: Diagnosis not present

## 2020-07-02 DIAGNOSIS — I10 Essential (primary) hypertension: Secondary | ICD-10-CM | POA: Diagnosis not present

## 2020-07-02 DIAGNOSIS — M189 Osteoarthritis of first carpometacarpal joint, unspecified: Secondary | ICD-10-CM | POA: Diagnosis not present

## 2020-07-02 DIAGNOSIS — E119 Type 2 diabetes mellitus without complications: Secondary | ICD-10-CM | POA: Diagnosis not present

## 2020-08-05 DIAGNOSIS — E782 Mixed hyperlipidemia: Secondary | ICD-10-CM | POA: Diagnosis not present

## 2020-08-05 DIAGNOSIS — I10 Essential (primary) hypertension: Secondary | ICD-10-CM | POA: Diagnosis not present

## 2020-08-05 DIAGNOSIS — E1142 Type 2 diabetes mellitus with diabetic polyneuropathy: Secondary | ICD-10-CM | POA: Diagnosis not present

## 2020-08-05 DIAGNOSIS — E119 Type 2 diabetes mellitus without complications: Secondary | ICD-10-CM | POA: Diagnosis not present

## 2020-09-16 DIAGNOSIS — F419 Anxiety disorder, unspecified: Secondary | ICD-10-CM | POA: Diagnosis not present

## 2020-09-16 DIAGNOSIS — E782 Mixed hyperlipidemia: Secondary | ICD-10-CM | POA: Diagnosis not present

## 2020-09-16 DIAGNOSIS — E119 Type 2 diabetes mellitus without complications: Secondary | ICD-10-CM | POA: Diagnosis not present

## 2020-09-16 DIAGNOSIS — I10 Essential (primary) hypertension: Secondary | ICD-10-CM | POA: Diagnosis not present

## 2020-09-29 DIAGNOSIS — M189 Osteoarthritis of first carpometacarpal joint, unspecified: Secondary | ICD-10-CM | POA: Diagnosis not present

## 2020-09-29 DIAGNOSIS — E119 Type 2 diabetes mellitus without complications: Secondary | ICD-10-CM | POA: Diagnosis not present

## 2020-09-29 DIAGNOSIS — I10 Essential (primary) hypertension: Secondary | ICD-10-CM | POA: Diagnosis not present

## 2020-09-29 DIAGNOSIS — E782 Mixed hyperlipidemia: Secondary | ICD-10-CM | POA: Diagnosis not present

## 2020-11-06 DIAGNOSIS — H40033 Anatomical narrow angle, bilateral: Secondary | ICD-10-CM | POA: Diagnosis not present

## 2020-11-06 DIAGNOSIS — E119 Type 2 diabetes mellitus without complications: Secondary | ICD-10-CM | POA: Diagnosis not present

## 2020-11-10 DIAGNOSIS — E782 Mixed hyperlipidemia: Secondary | ICD-10-CM | POA: Diagnosis not present

## 2020-11-10 DIAGNOSIS — E119 Type 2 diabetes mellitus without complications: Secondary | ICD-10-CM | POA: Diagnosis not present

## 2020-11-10 DIAGNOSIS — M189 Osteoarthritis of first carpometacarpal joint, unspecified: Secondary | ICD-10-CM | POA: Diagnosis not present

## 2020-11-10 DIAGNOSIS — I10 Essential (primary) hypertension: Secondary | ICD-10-CM | POA: Diagnosis not present

## 2020-12-16 DIAGNOSIS — M189 Osteoarthritis of first carpometacarpal joint, unspecified: Secondary | ICD-10-CM | POA: Diagnosis not present

## 2020-12-16 DIAGNOSIS — E782 Mixed hyperlipidemia: Secondary | ICD-10-CM | POA: Diagnosis not present

## 2020-12-16 DIAGNOSIS — E119 Type 2 diabetes mellitus without complications: Secondary | ICD-10-CM | POA: Diagnosis not present

## 2020-12-16 DIAGNOSIS — I10 Essential (primary) hypertension: Secondary | ICD-10-CM | POA: Diagnosis not present

## 2021-01-28 ENCOUNTER — Inpatient Hospital Stay: Payer: Medicare Other | Admitting: Hematology and Oncology

## 2021-04-06 DIAGNOSIS — I10 Essential (primary) hypertension: Secondary | ICD-10-CM | POA: Diagnosis not present

## 2021-04-06 DIAGNOSIS — E782 Mixed hyperlipidemia: Secondary | ICD-10-CM | POA: Diagnosis not present

## 2021-04-06 DIAGNOSIS — E1165 Type 2 diabetes mellitus with hyperglycemia: Secondary | ICD-10-CM | POA: Diagnosis not present

## 2021-04-06 DIAGNOSIS — E119 Type 2 diabetes mellitus without complications: Secondary | ICD-10-CM | POA: Diagnosis not present

## 2021-04-12 DIAGNOSIS — E782 Mixed hyperlipidemia: Secondary | ICD-10-CM | POA: Diagnosis not present

## 2021-04-12 DIAGNOSIS — J0100 Acute maxillary sinusitis, unspecified: Secondary | ICD-10-CM | POA: Diagnosis not present

## 2021-04-12 DIAGNOSIS — E119 Type 2 diabetes mellitus without complications: Secondary | ICD-10-CM | POA: Diagnosis not present

## 2021-04-12 DIAGNOSIS — I10 Essential (primary) hypertension: Secondary | ICD-10-CM | POA: Diagnosis not present

## 2021-07-12 DIAGNOSIS — E119 Type 2 diabetes mellitus without complications: Secondary | ICD-10-CM | POA: Diagnosis not present

## 2021-07-12 DIAGNOSIS — E782 Mixed hyperlipidemia: Secondary | ICD-10-CM | POA: Diagnosis not present

## 2021-07-12 DIAGNOSIS — E1142 Type 2 diabetes mellitus with diabetic polyneuropathy: Secondary | ICD-10-CM | POA: Diagnosis not present

## 2021-07-12 DIAGNOSIS — I10 Essential (primary) hypertension: Secondary | ICD-10-CM | POA: Diagnosis not present

## 2021-09-20 DIAGNOSIS — Z0000 Encounter for general adult medical examination without abnormal findings: Secondary | ICD-10-CM | POA: Diagnosis not present

## 2021-09-20 DIAGNOSIS — E782 Mixed hyperlipidemia: Secondary | ICD-10-CM | POA: Diagnosis not present

## 2021-09-20 DIAGNOSIS — E1142 Type 2 diabetes mellitus with diabetic polyneuropathy: Secondary | ICD-10-CM | POA: Diagnosis not present

## 2021-09-20 DIAGNOSIS — I10 Essential (primary) hypertension: Secondary | ICD-10-CM | POA: Diagnosis not present

## 2021-09-26 DIAGNOSIS — E1165 Type 2 diabetes mellitus with hyperglycemia: Secondary | ICD-10-CM | POA: Diagnosis not present

## 2021-09-26 DIAGNOSIS — I10 Essential (primary) hypertension: Secondary | ICD-10-CM | POA: Diagnosis not present

## 2021-09-26 DIAGNOSIS — E782 Mixed hyperlipidemia: Secondary | ICD-10-CM | POA: Diagnosis not present

## 2021-10-04 DIAGNOSIS — N8110 Cystocele, unspecified: Secondary | ICD-10-CM | POA: Diagnosis not present

## 2021-11-02 DIAGNOSIS — N760 Acute vaginitis: Secondary | ICD-10-CM | POA: Diagnosis not present

## 2021-11-23 DIAGNOSIS — N760 Acute vaginitis: Secondary | ICD-10-CM | POA: Diagnosis not present

## 2021-11-23 DIAGNOSIS — N9089 Other specified noninflammatory disorders of vulva and perineum: Secondary | ICD-10-CM | POA: Diagnosis not present

## 2021-11-23 DIAGNOSIS — L449 Papulosquamous disorder, unspecified: Secondary | ICD-10-CM | POA: Diagnosis not present

## 2021-11-23 DIAGNOSIS — N3941 Urge incontinence: Secondary | ICD-10-CM | POA: Diagnosis not present

## 2021-12-16 DIAGNOSIS — E782 Mixed hyperlipidemia: Secondary | ICD-10-CM | POA: Diagnosis not present

## 2021-12-16 DIAGNOSIS — I10 Essential (primary) hypertension: Secondary | ICD-10-CM | POA: Diagnosis not present

## 2021-12-16 DIAGNOSIS — E1165 Type 2 diabetes mellitus with hyperglycemia: Secondary | ICD-10-CM | POA: Diagnosis not present

## 2022-01-03 DIAGNOSIS — I10 Essential (primary) hypertension: Secondary | ICD-10-CM | POA: Diagnosis not present

## 2022-01-03 DIAGNOSIS — E119 Type 2 diabetes mellitus without complications: Secondary | ICD-10-CM | POA: Diagnosis not present

## 2022-01-03 DIAGNOSIS — E782 Mixed hyperlipidemia: Secondary | ICD-10-CM | POA: Diagnosis not present

## 2022-01-10 DIAGNOSIS — R2689 Other abnormalities of gait and mobility: Secondary | ICD-10-CM | POA: Diagnosis not present

## 2022-01-10 DIAGNOSIS — I679 Cerebrovascular disease, unspecified: Secondary | ICD-10-CM | POA: Diagnosis not present

## 2022-01-10 DIAGNOSIS — E119 Type 2 diabetes mellitus without complications: Secondary | ICD-10-CM | POA: Diagnosis not present

## 2022-01-10 DIAGNOSIS — I10 Essential (primary) hypertension: Secondary | ICD-10-CM | POA: Diagnosis not present

## 2022-01-26 DIAGNOSIS — M79622 Pain in left upper arm: Secondary | ICD-10-CM | POA: Diagnosis not present

## 2022-01-26 DIAGNOSIS — M25512 Pain in left shoulder: Secondary | ICD-10-CM | POA: Diagnosis not present

## 2022-01-26 DIAGNOSIS — M7918 Myalgia, other site: Secondary | ICD-10-CM | POA: Diagnosis not present

## 2022-01-26 DIAGNOSIS — M25572 Pain in left ankle and joints of left foot: Secondary | ICD-10-CM | POA: Diagnosis not present

## 2022-01-26 DIAGNOSIS — M79652 Pain in left thigh: Secondary | ICD-10-CM | POA: Diagnosis not present

## 2022-02-07 DIAGNOSIS — S39012D Strain of muscle, fascia and tendon of lower back, subsequent encounter: Secondary | ICD-10-CM | POA: Diagnosis not present

## 2022-02-07 DIAGNOSIS — N814 Uterovaginal prolapse, unspecified: Secondary | ICD-10-CM | POA: Diagnosis not present

## 2022-02-15 DIAGNOSIS — R2689 Other abnormalities of gait and mobility: Secondary | ICD-10-CM | POA: Diagnosis not present

## 2022-02-15 DIAGNOSIS — M5137 Other intervertebral disc degeneration, lumbosacral region: Secondary | ICD-10-CM | POA: Diagnosis not present

## 2022-02-15 DIAGNOSIS — M6281 Muscle weakness (generalized): Secondary | ICD-10-CM | POA: Diagnosis not present

## 2022-02-15 DIAGNOSIS — M48061 Spinal stenosis, lumbar region without neurogenic claudication: Secondary | ICD-10-CM | POA: Diagnosis not present

## 2022-02-16 DIAGNOSIS — M5137 Other intervertebral disc degeneration, lumbosacral region: Secondary | ICD-10-CM | POA: Diagnosis not present

## 2022-02-16 DIAGNOSIS — M48061 Spinal stenosis, lumbar region without neurogenic claudication: Secondary | ICD-10-CM | POA: Diagnosis not present

## 2022-02-16 DIAGNOSIS — R2689 Other abnormalities of gait and mobility: Secondary | ICD-10-CM | POA: Diagnosis not present

## 2022-02-16 DIAGNOSIS — M6281 Muscle weakness (generalized): Secondary | ICD-10-CM | POA: Diagnosis not present

## 2022-02-17 DIAGNOSIS — R2689 Other abnormalities of gait and mobility: Secondary | ICD-10-CM | POA: Diagnosis not present

## 2022-02-17 DIAGNOSIS — M5137 Other intervertebral disc degeneration, lumbosacral region: Secondary | ICD-10-CM | POA: Diagnosis not present

## 2022-02-17 DIAGNOSIS — M48061 Spinal stenosis, lumbar region without neurogenic claudication: Secondary | ICD-10-CM | POA: Diagnosis not present

## 2022-02-17 DIAGNOSIS — M6281 Muscle weakness (generalized): Secondary | ICD-10-CM | POA: Diagnosis not present

## 2022-02-19 DIAGNOSIS — M5137 Other intervertebral disc degeneration, lumbosacral region: Secondary | ICD-10-CM | POA: Diagnosis not present

## 2022-02-19 DIAGNOSIS — M6281 Muscle weakness (generalized): Secondary | ICD-10-CM | POA: Diagnosis not present

## 2022-02-19 DIAGNOSIS — R2689 Other abnormalities of gait and mobility: Secondary | ICD-10-CM | POA: Diagnosis not present

## 2022-02-19 DIAGNOSIS — M48061 Spinal stenosis, lumbar region without neurogenic claudication: Secondary | ICD-10-CM | POA: Diagnosis not present

## 2022-02-20 DIAGNOSIS — M48061 Spinal stenosis, lumbar region without neurogenic claudication: Secondary | ICD-10-CM | POA: Diagnosis not present

## 2022-02-20 DIAGNOSIS — R2689 Other abnormalities of gait and mobility: Secondary | ICD-10-CM | POA: Diagnosis not present

## 2022-02-20 DIAGNOSIS — M5137 Other intervertebral disc degeneration, lumbosacral region: Secondary | ICD-10-CM | POA: Diagnosis not present

## 2022-02-20 DIAGNOSIS — M6281 Muscle weakness (generalized): Secondary | ICD-10-CM | POA: Diagnosis not present

## 2022-02-21 DIAGNOSIS — M48061 Spinal stenosis, lumbar region without neurogenic claudication: Secondary | ICD-10-CM | POA: Diagnosis not present

## 2022-02-21 DIAGNOSIS — M6281 Muscle weakness (generalized): Secondary | ICD-10-CM | POA: Diagnosis not present

## 2022-02-21 DIAGNOSIS — R2689 Other abnormalities of gait and mobility: Secondary | ICD-10-CM | POA: Diagnosis not present

## 2022-02-21 DIAGNOSIS — M5137 Other intervertebral disc degeneration, lumbosacral region: Secondary | ICD-10-CM | POA: Diagnosis not present

## 2022-02-22 DIAGNOSIS — E1142 Type 2 diabetes mellitus with diabetic polyneuropathy: Secondary | ICD-10-CM | POA: Diagnosis not present

## 2022-02-22 DIAGNOSIS — M6281 Muscle weakness (generalized): Secondary | ICD-10-CM | POA: Diagnosis not present

## 2022-02-22 DIAGNOSIS — R278 Other lack of coordination: Secondary | ICD-10-CM | POA: Diagnosis not present

## 2022-02-23 DIAGNOSIS — M5137 Other intervertebral disc degeneration, lumbosacral region: Secondary | ICD-10-CM | POA: Diagnosis not present

## 2022-02-23 DIAGNOSIS — R2689 Other abnormalities of gait and mobility: Secondary | ICD-10-CM | POA: Diagnosis not present

## 2022-02-23 DIAGNOSIS — M6281 Muscle weakness (generalized): Secondary | ICD-10-CM | POA: Diagnosis not present

## 2022-02-23 DIAGNOSIS — M48061 Spinal stenosis, lumbar region without neurogenic claudication: Secondary | ICD-10-CM | POA: Diagnosis not present

## 2022-02-24 DIAGNOSIS — M6281 Muscle weakness (generalized): Secondary | ICD-10-CM | POA: Diagnosis not present

## 2022-02-24 DIAGNOSIS — E1142 Type 2 diabetes mellitus with diabetic polyneuropathy: Secondary | ICD-10-CM | POA: Diagnosis not present

## 2022-02-24 DIAGNOSIS — R278 Other lack of coordination: Secondary | ICD-10-CM | POA: Diagnosis not present

## 2022-02-27 DIAGNOSIS — E1142 Type 2 diabetes mellitus with diabetic polyneuropathy: Secondary | ICD-10-CM | POA: Diagnosis not present

## 2022-02-27 DIAGNOSIS — R278 Other lack of coordination: Secondary | ICD-10-CM | POA: Diagnosis not present

## 2022-02-27 DIAGNOSIS — M6281 Muscle weakness (generalized): Secondary | ICD-10-CM | POA: Diagnosis not present

## 2022-02-28 DIAGNOSIS — M5137 Other intervertebral disc degeneration, lumbosacral region: Secondary | ICD-10-CM | POA: Diagnosis not present

## 2022-02-28 DIAGNOSIS — M6281 Muscle weakness (generalized): Secondary | ICD-10-CM | POA: Diagnosis not present

## 2022-02-28 DIAGNOSIS — R2689 Other abnormalities of gait and mobility: Secondary | ICD-10-CM | POA: Diagnosis not present

## 2022-02-28 DIAGNOSIS — M48061 Spinal stenosis, lumbar region without neurogenic claudication: Secondary | ICD-10-CM | POA: Diagnosis not present

## 2022-02-28 DIAGNOSIS — E1142 Type 2 diabetes mellitus with diabetic polyneuropathy: Secondary | ICD-10-CM | POA: Diagnosis not present

## 2022-02-28 DIAGNOSIS — R278 Other lack of coordination: Secondary | ICD-10-CM | POA: Diagnosis not present

## 2022-03-01 DIAGNOSIS — N838 Other noninflammatory disorders of ovary, fallopian tube and broad ligament: Secondary | ICD-10-CM | POA: Diagnosis not present

## 2022-03-01 DIAGNOSIS — Z4689 Encounter for fitting and adjustment of other specified devices: Secondary | ICD-10-CM | POA: Diagnosis not present

## 2022-03-01 DIAGNOSIS — N852 Hypertrophy of uterus: Secondary | ICD-10-CM | POA: Diagnosis not present

## 2022-03-02 DIAGNOSIS — M48061 Spinal stenosis, lumbar region without neurogenic claudication: Secondary | ICD-10-CM | POA: Diagnosis not present

## 2022-03-02 DIAGNOSIS — E1142 Type 2 diabetes mellitus with diabetic polyneuropathy: Secondary | ICD-10-CM | POA: Diagnosis not present

## 2022-03-02 DIAGNOSIS — S39012D Strain of muscle, fascia and tendon of lower back, subsequent encounter: Secondary | ICD-10-CM | POA: Diagnosis not present

## 2022-03-02 DIAGNOSIS — R41841 Cognitive communication deficit: Secondary | ICD-10-CM | POA: Diagnosis not present

## 2022-03-02 DIAGNOSIS — R278 Other lack of coordination: Secondary | ICD-10-CM | POA: Diagnosis not present

## 2022-03-02 DIAGNOSIS — M6281 Muscle weakness (generalized): Secondary | ICD-10-CM | POA: Diagnosis not present

## 2022-03-02 DIAGNOSIS — M5137 Other intervertebral disc degeneration, lumbosacral region: Secondary | ICD-10-CM | POA: Diagnosis not present

## 2022-03-02 DIAGNOSIS — R2689 Other abnormalities of gait and mobility: Secondary | ICD-10-CM | POA: Diagnosis not present

## 2022-03-03 DIAGNOSIS — S39012D Strain of muscle, fascia and tendon of lower back, subsequent encounter: Secondary | ICD-10-CM | POA: Diagnosis not present

## 2022-03-03 DIAGNOSIS — R41841 Cognitive communication deficit: Secondary | ICD-10-CM | POA: Diagnosis not present

## 2022-03-06 DIAGNOSIS — S39012D Strain of muscle, fascia and tendon of lower back, subsequent encounter: Secondary | ICD-10-CM | POA: Diagnosis not present

## 2022-03-06 DIAGNOSIS — R41841 Cognitive communication deficit: Secondary | ICD-10-CM | POA: Diagnosis not present

## 2022-03-08 DIAGNOSIS — M6281 Muscle weakness (generalized): Secondary | ICD-10-CM | POA: Diagnosis not present

## 2022-03-08 DIAGNOSIS — E1142 Type 2 diabetes mellitus with diabetic polyneuropathy: Secondary | ICD-10-CM | POA: Diagnosis not present

## 2022-03-08 DIAGNOSIS — R278 Other lack of coordination: Secondary | ICD-10-CM | POA: Diagnosis not present

## 2022-03-09 DIAGNOSIS — M6281 Muscle weakness (generalized): Secondary | ICD-10-CM | POA: Diagnosis not present

## 2022-03-09 DIAGNOSIS — E1142 Type 2 diabetes mellitus with diabetic polyneuropathy: Secondary | ICD-10-CM | POA: Diagnosis not present

## 2022-03-09 DIAGNOSIS — R278 Other lack of coordination: Secondary | ICD-10-CM | POA: Diagnosis not present

## 2022-03-10 DIAGNOSIS — S39012D Strain of muscle, fascia and tendon of lower back, subsequent encounter: Secondary | ICD-10-CM | POA: Diagnosis not present

## 2022-03-10 DIAGNOSIS — M6281 Muscle weakness (generalized): Secondary | ICD-10-CM | POA: Diagnosis not present

## 2022-03-10 DIAGNOSIS — R278 Other lack of coordination: Secondary | ICD-10-CM | POA: Diagnosis not present

## 2022-03-10 DIAGNOSIS — R2689 Other abnormalities of gait and mobility: Secondary | ICD-10-CM | POA: Diagnosis not present

## 2022-03-10 DIAGNOSIS — R41841 Cognitive communication deficit: Secondary | ICD-10-CM | POA: Diagnosis not present

## 2022-03-10 DIAGNOSIS — M48061 Spinal stenosis, lumbar region without neurogenic claudication: Secondary | ICD-10-CM | POA: Diagnosis not present

## 2022-03-10 DIAGNOSIS — E1142 Type 2 diabetes mellitus with diabetic polyneuropathy: Secondary | ICD-10-CM | POA: Diagnosis not present

## 2022-03-10 DIAGNOSIS — M5137 Other intervertebral disc degeneration, lumbosacral region: Secondary | ICD-10-CM | POA: Diagnosis not present

## 2022-03-14 DIAGNOSIS — M6281 Muscle weakness (generalized): Secondary | ICD-10-CM | POA: Diagnosis not present

## 2022-03-14 DIAGNOSIS — R278 Other lack of coordination: Secondary | ICD-10-CM | POA: Diagnosis not present

## 2022-03-14 DIAGNOSIS — E1142 Type 2 diabetes mellitus with diabetic polyneuropathy: Secondary | ICD-10-CM | POA: Diagnosis not present

## 2022-03-15 DIAGNOSIS — R2689 Other abnormalities of gait and mobility: Secondary | ICD-10-CM | POA: Diagnosis not present

## 2022-03-15 DIAGNOSIS — R41841 Cognitive communication deficit: Secondary | ICD-10-CM | POA: Diagnosis not present

## 2022-03-15 DIAGNOSIS — S39012D Strain of muscle, fascia and tendon of lower back, subsequent encounter: Secondary | ICD-10-CM | POA: Diagnosis not present

## 2022-03-15 DIAGNOSIS — M48061 Spinal stenosis, lumbar region without neurogenic claudication: Secondary | ICD-10-CM | POA: Diagnosis not present

## 2022-03-15 DIAGNOSIS — M6281 Muscle weakness (generalized): Secondary | ICD-10-CM | POA: Diagnosis not present

## 2022-03-15 DIAGNOSIS — M5137 Other intervertebral disc degeneration, lumbosacral region: Secondary | ICD-10-CM | POA: Diagnosis not present

## 2022-03-16 DIAGNOSIS — R278 Other lack of coordination: Secondary | ICD-10-CM | POA: Diagnosis not present

## 2022-03-16 DIAGNOSIS — M6281 Muscle weakness (generalized): Secondary | ICD-10-CM | POA: Diagnosis not present

## 2022-03-16 DIAGNOSIS — E1142 Type 2 diabetes mellitus with diabetic polyneuropathy: Secondary | ICD-10-CM | POA: Diagnosis not present

## 2022-03-17 DIAGNOSIS — R278 Other lack of coordination: Secondary | ICD-10-CM | POA: Diagnosis not present

## 2022-03-17 DIAGNOSIS — R41841 Cognitive communication deficit: Secondary | ICD-10-CM | POA: Diagnosis not present

## 2022-03-17 DIAGNOSIS — M6281 Muscle weakness (generalized): Secondary | ICD-10-CM | POA: Diagnosis not present

## 2022-03-17 DIAGNOSIS — E1142 Type 2 diabetes mellitus with diabetic polyneuropathy: Secondary | ICD-10-CM | POA: Diagnosis not present

## 2022-03-17 DIAGNOSIS — S39012D Strain of muscle, fascia and tendon of lower back, subsequent encounter: Secondary | ICD-10-CM | POA: Diagnosis not present

## 2022-03-20 DIAGNOSIS — E1142 Type 2 diabetes mellitus with diabetic polyneuropathy: Secondary | ICD-10-CM | POA: Diagnosis not present

## 2022-03-20 DIAGNOSIS — R41841 Cognitive communication deficit: Secondary | ICD-10-CM | POA: Diagnosis not present

## 2022-03-20 DIAGNOSIS — M6281 Muscle weakness (generalized): Secondary | ICD-10-CM | POA: Diagnosis not present

## 2022-03-20 DIAGNOSIS — M48061 Spinal stenosis, lumbar region without neurogenic claudication: Secondary | ICD-10-CM | POA: Diagnosis not present

## 2022-03-20 DIAGNOSIS — R2689 Other abnormalities of gait and mobility: Secondary | ICD-10-CM | POA: Diagnosis not present

## 2022-03-20 DIAGNOSIS — S39012D Strain of muscle, fascia and tendon of lower back, subsequent encounter: Secondary | ICD-10-CM | POA: Diagnosis not present

## 2022-03-20 DIAGNOSIS — M5137 Other intervertebral disc degeneration, lumbosacral region: Secondary | ICD-10-CM | POA: Diagnosis not present

## 2022-03-20 DIAGNOSIS — R278 Other lack of coordination: Secondary | ICD-10-CM | POA: Diagnosis not present

## 2022-03-21 DIAGNOSIS — S39012D Strain of muscle, fascia and tendon of lower back, subsequent encounter: Secondary | ICD-10-CM | POA: Diagnosis not present

## 2022-03-21 DIAGNOSIS — R41841 Cognitive communication deficit: Secondary | ICD-10-CM | POA: Diagnosis not present

## 2022-03-22 ENCOUNTER — Emergency Department (HOSPITAL_COMMUNITY): Payer: Medicare Other

## 2022-03-22 ENCOUNTER — Encounter (HOSPITAL_COMMUNITY): Payer: Self-pay

## 2022-03-22 ENCOUNTER — Inpatient Hospital Stay (HOSPITAL_COMMUNITY)
Admission: EM | Admit: 2022-03-22 | Discharge: 2022-03-25 | DRG: 871 | Disposition: A | Discharge status: Skilled Nursing Facility | Payer: Medicare Other | Attending: Internal Medicine | Admitting: Internal Medicine

## 2022-03-22 ENCOUNTER — Other Ambulatory Visit: Payer: Self-pay

## 2022-03-22 DIAGNOSIS — M25562 Pain in left knee: Secondary | ICD-10-CM | POA: Diagnosis present

## 2022-03-22 DIAGNOSIS — Z853 Personal history of malignant neoplasm of breast: Secondary | ICD-10-CM

## 2022-03-22 DIAGNOSIS — L89152 Pressure ulcer of sacral region, stage 2: Secondary | ICD-10-CM | POA: Diagnosis not present

## 2022-03-22 DIAGNOSIS — F419 Anxiety disorder, unspecified: Secondary | ICD-10-CM | POA: Diagnosis not present

## 2022-03-22 DIAGNOSIS — K219 Gastro-esophageal reflux disease without esophagitis: Secondary | ICD-10-CM | POA: Diagnosis not present

## 2022-03-22 DIAGNOSIS — Z1611 Resistance to penicillins: Secondary | ICD-10-CM | POA: Diagnosis present

## 2022-03-22 DIAGNOSIS — R4182 Altered mental status, unspecified: Secondary | ICD-10-CM | POA: Diagnosis not present

## 2022-03-22 DIAGNOSIS — M25512 Pain in left shoulder: Secondary | ICD-10-CM | POA: Diagnosis present

## 2022-03-22 DIAGNOSIS — E1165 Type 2 diabetes mellitus with hyperglycemia: Secondary | ICD-10-CM | POA: Diagnosis present

## 2022-03-22 DIAGNOSIS — B961 Klebsiella pneumoniae [K. pneumoniae] as the cause of diseases classified elsewhere: Secondary | ICD-10-CM | POA: Diagnosis not present

## 2022-03-22 DIAGNOSIS — M6282 Rhabdomyolysis: Secondary | ICD-10-CM | POA: Diagnosis not present

## 2022-03-22 DIAGNOSIS — A4159 Other Gram-negative sepsis: Principal | ICD-10-CM | POA: Diagnosis present

## 2022-03-22 DIAGNOSIS — R6520 Severe sepsis without septic shock: Secondary | ICD-10-CM | POA: Diagnosis present

## 2022-03-22 DIAGNOSIS — N3001 Acute cystitis with hematuria: Secondary | ICD-10-CM

## 2022-03-22 DIAGNOSIS — E119 Type 2 diabetes mellitus without complications: Secondary | ICD-10-CM

## 2022-03-22 DIAGNOSIS — Z882 Allergy status to sulfonamides status: Secondary | ICD-10-CM

## 2022-03-22 DIAGNOSIS — M25511 Pain in right shoulder: Secondary | ICD-10-CM | POA: Diagnosis present

## 2022-03-22 DIAGNOSIS — Z7982 Long term (current) use of aspirin: Secondary | ICD-10-CM

## 2022-03-22 DIAGNOSIS — G9349 Other encephalopathy: Secondary | ICD-10-CM | POA: Diagnosis not present

## 2022-03-22 DIAGNOSIS — Z79899 Other long term (current) drug therapy: Secondary | ICD-10-CM

## 2022-03-22 DIAGNOSIS — M47812 Spondylosis without myelopathy or radiculopathy, cervical region: Secondary | ICD-10-CM | POA: Diagnosis not present

## 2022-03-22 DIAGNOSIS — Z881 Allergy status to other antibiotic agents status: Secondary | ICD-10-CM

## 2022-03-22 DIAGNOSIS — N390 Urinary tract infection, site not specified: Secondary | ICD-10-CM

## 2022-03-22 DIAGNOSIS — G9340 Encephalopathy, unspecified: Secondary | ICD-10-CM | POA: Diagnosis not present

## 2022-03-22 DIAGNOSIS — I6523 Occlusion and stenosis of bilateral carotid arteries: Secondary | ICD-10-CM | POA: Diagnosis not present

## 2022-03-22 DIAGNOSIS — G9341 Metabolic encephalopathy: Secondary | ICD-10-CM | POA: Diagnosis present

## 2022-03-22 DIAGNOSIS — Z9011 Acquired absence of right breast and nipple: Secondary | ICD-10-CM

## 2022-03-22 DIAGNOSIS — W19XXXA Unspecified fall, initial encounter: Secondary | ICD-10-CM | POA: Diagnosis present

## 2022-03-22 DIAGNOSIS — J150 Pneumonia due to Klebsiella pneumoniae: Secondary | ICD-10-CM | POA: Diagnosis present

## 2022-03-22 DIAGNOSIS — I69331 Monoplegia of upper limb following cerebral infarction affecting right dominant side: Secondary | ICD-10-CM | POA: Diagnosis not present

## 2022-03-22 DIAGNOSIS — I2510 Atherosclerotic heart disease of native coronary artery without angina pectoris: Secondary | ICD-10-CM | POA: Diagnosis present

## 2022-03-22 DIAGNOSIS — Z885 Allergy status to narcotic agent status: Secondary | ICD-10-CM

## 2022-03-22 DIAGNOSIS — E785 Hyperlipidemia, unspecified: Secondary | ICD-10-CM | POA: Diagnosis present

## 2022-03-22 DIAGNOSIS — I10 Essential (primary) hypertension: Secondary | ICD-10-CM | POA: Diagnosis not present

## 2022-03-22 DIAGNOSIS — A419 Sepsis, unspecified organism: Principal | ICD-10-CM

## 2022-03-22 DIAGNOSIS — M5136 Other intervertebral disc degeneration, lumbar region: Secondary | ICD-10-CM | POA: Diagnosis present

## 2022-03-22 DIAGNOSIS — L8990 Pressure ulcer of unspecified site, unspecified stage: Secondary | ICD-10-CM | POA: Insufficient documentation

## 2022-03-22 DIAGNOSIS — Z20822 Contact with and (suspected) exposure to covid-19: Secondary | ICD-10-CM | POA: Diagnosis present

## 2022-03-22 DIAGNOSIS — T796XXA Traumatic ischemia of muscle, initial encounter: Secondary | ICD-10-CM | POA: Diagnosis not present

## 2022-03-22 DIAGNOSIS — I672 Cerebral atherosclerosis: Secondary | ICD-10-CM | POA: Diagnosis not present

## 2022-03-22 DIAGNOSIS — E876 Hypokalemia: Secondary | ICD-10-CM | POA: Diagnosis present

## 2022-03-22 DIAGNOSIS — Z7984 Long term (current) use of oral hypoglycemic drugs: Secondary | ICD-10-CM

## 2022-03-22 DIAGNOSIS — E872 Acidosis, unspecified: Secondary | ICD-10-CM | POA: Diagnosis not present

## 2022-03-22 DIAGNOSIS — Z043 Encounter for examination and observation following other accident: Secondary | ICD-10-CM | POA: Diagnosis not present

## 2022-03-22 DIAGNOSIS — S0993XA Unspecified injury of face, initial encounter: Secondary | ICD-10-CM | POA: Diagnosis not present

## 2022-03-22 DIAGNOSIS — Z888 Allergy status to other drugs, medicaments and biological substances status: Secondary | ICD-10-CM

## 2022-03-22 DIAGNOSIS — M797 Fibromyalgia: Secondary | ICD-10-CM | POA: Diagnosis present

## 2022-03-22 DIAGNOSIS — R296 Repeated falls: Secondary | ICD-10-CM | POA: Insufficient documentation

## 2022-03-22 DIAGNOSIS — M25561 Pain in right knee: Secondary | ICD-10-CM | POA: Diagnosis present

## 2022-03-22 DIAGNOSIS — M25559 Pain in unspecified hip: Secondary | ICD-10-CM | POA: Diagnosis not present

## 2022-03-22 DIAGNOSIS — R21 Rash and other nonspecific skin eruption: Secondary | ICD-10-CM | POA: Diagnosis not present

## 2022-03-22 LAB — PROTIME-INR
INR: 1.1 (ref 0.8–1.2)
Prothrombin Time: 13.7 seconds (ref 11.4–15.2)

## 2022-03-22 LAB — URINALYSIS, ROUTINE W REFLEX MICROSCOPIC
Bilirubin Urine: NEGATIVE
Glucose, UA: NEGATIVE mg/dL
Ketones, ur: 5 mg/dL — AB
Nitrite: NEGATIVE
Protein, ur: 30 mg/dL — AB
Specific Gravity, Urine: 1.02 (ref 1.005–1.030)
WBC, UA: >50 WBC/hpf — ABNORMAL HIGH (ref 0–5)
pH: 5 (ref 5.0–8.0)

## 2022-03-22 LAB — CBC WITH DIFFERENTIAL/PLATELET
Abs Immature Granulocytes: 0.1 10*3/uL — ABNORMAL HIGH (ref 0.00–0.07)
Basophils Absolute: 0 10*3/uL (ref 0.0–0.1)
Basophils Relative: 0 %
Eosinophils Absolute: 0 10*3/uL (ref 0.0–0.5)
Eosinophils Relative: 0 %
HCT: 47.6 % — ABNORMAL HIGH (ref 36.0–46.0)
Hemoglobin: 16 g/dL — ABNORMAL HIGH (ref 12.0–15.0)
Immature Granulocytes: 1 %
Lymphocytes Relative: 5 %
Lymphs Abs: 0.9 10*3/uL (ref 0.7–4.0)
MCH: 30.7 pg (ref 26.0–34.0)
MCHC: 33.6 g/dL (ref 30.0–36.0)
MCV: 91.4 fL (ref 80.0–100.0)
Monocytes Absolute: 0.9 10*3/uL (ref 0.1–1.0)
Monocytes Relative: 5 %
Neutro Abs: 16.9 10*3/uL — ABNORMAL HIGH (ref 1.7–7.7)
Neutrophils Relative %: 89 %
Platelets: 332 10*3/uL (ref 150–400)
RBC: 5.21 MIL/uL — ABNORMAL HIGH (ref 3.87–5.11)
RDW: 12.7 % (ref 11.5–15.5)
WBC: 18.9 10*3/uL — ABNORMAL HIGH (ref 4.0–10.5)
nRBC: 0 % (ref 0.0–0.2)

## 2022-03-22 LAB — LACTIC ACID, PLASMA
Lactic Acid, Venous: 2.5 mmol/L (ref 0.5–1.9)
Lactic Acid, Venous: 2.2 mmol/L (ref 0.5–1.9)

## 2022-03-22 LAB — COMPREHENSIVE METABOLIC PANEL
ALT: 41 U/L (ref 0–44)
AST: 112 U/L — ABNORMAL HIGH (ref 15–41)
Albumin: 3.7 g/dL (ref 3.5–5.0)
Alkaline Phosphatase: 55 U/L (ref 38–126)
Anion gap: 13 (ref 5–15)
BUN: 25 mg/dL — ABNORMAL HIGH (ref 8–23)
CO2: 24 mmol/L (ref 22–32)
Calcium: 9.2 mg/dL (ref 8.9–10.3)
Chloride: 101 mmol/L (ref 98–111)
Creatinine, Ser: 0.95 mg/dL (ref 0.44–1.00)
GFR, Estimated: 60 mL/min — ABNORMAL LOW (ref >60)
Glucose, Bld: 193 mg/dL — ABNORMAL HIGH (ref 70–99)
Potassium: 3.6 mmol/L (ref 3.5–5.1)
Sodium: 138 mmol/L (ref 135–145)
Total Bilirubin: 1.1 mg/dL (ref 0.3–1.2)
Total Protein: 6.8 g/dL (ref 6.5–8.1)

## 2022-03-22 LAB — CK: Total CK: 3928 U/L — ABNORMAL HIGH (ref 38–234)

## 2022-03-22 LAB — CBG MONITORING, ED
Glucose-Capillary: 177 mg/dL — ABNORMAL HIGH (ref 70–99)
Glucose-Capillary: 99 mg/dL (ref 70–99)

## 2022-03-22 LAB — SARS CORONAVIRUS 2 BY RT PCR: SARS Coronavirus 2 by RT PCR: NEGATIVE

## 2022-03-22 MED ORDER — ACETAMINOPHEN 650 MG RE SUPP: 650.0000 mg | Freq: Four times a day (QID) | RECTAL | Status: DC | PRN

## 2022-03-22 MED ORDER — LACTATED RINGERS IV BOLUS
1000.0000 mL | Freq: Once | INTRAVENOUS | Status: AC
  Administered 2022-03-22: 1000 mL via INTRAVENOUS

## 2022-03-22 MED ORDER — METFORMIN HCL ER 500 MG PO TB24: 500.0000 mg | ORAL_TABLET | Freq: Every day | ORAL | Status: DC

## 2022-03-22 MED ORDER — PRAVASTATIN SODIUM 10 MG PO TABS
20.0000 mg | ORAL_TABLET | Freq: Every day | ORAL | Status: DC
  Administered 2022-03-22 – 2022-03-25 (×4): 20 mg via ORAL
  Filled 2022-03-22 (×4): qty 2

## 2022-03-22 MED ORDER — INSULIN ASPART 100 UNIT/ML IJ SOLN
0.0000 [IU] | Freq: Three times a day (TID) | INTRAMUSCULAR | Status: DC
  Administered 2022-03-23 – 2022-03-24 (×2): 2 [IU] via SUBCUTANEOUS

## 2022-03-22 MED ORDER — ENOXAPARIN SODIUM 40 MG/0.4ML IJ SOSY
40.0000 mg | PREFILLED_SYRINGE | INTRAMUSCULAR | Status: DC
  Administered 2022-03-22 – 2022-03-24 (×3): 40 mg via SUBCUTANEOUS
  Filled 2022-03-22 (×3): qty 0.4

## 2022-03-22 MED ORDER — SODIUM CHLORIDE 0.9 % IV SOLN
1.0000 g | INTRAVENOUS | Status: DC
  Administered 2022-03-23 – 2022-03-24 (×2): 1 g via INTRAVENOUS
  Filled 2022-03-22 (×2): qty 10

## 2022-03-22 MED ORDER — SODIUM CHLORIDE 0.9 % IV SOLN
1.0000 g | Freq: Once | INTRAVENOUS | Status: AC
  Administered 2022-03-22: 1 g via INTRAVENOUS
  Filled 2022-03-22: qty 10

## 2022-03-22 MED ORDER — ACETAMINOPHEN 325 MG PO TABS: 650.0000 mg | ORAL_TABLET | Freq: Four times a day (QID) | ORAL | Status: DC | PRN

## 2022-03-22 MED ORDER — POLYETHYLENE GLYCOL 3350 17 G PO PACK: 17.0000 g | PACK | Freq: Every day | ORAL | Status: DC | PRN

## 2022-03-22 MED ORDER — LACTATED RINGERS IV SOLN: INTRAVENOUS | Status: AC

## 2022-03-22 NOTE — ED Notes (Signed)
Foam dressing placed on sacrum

## 2022-03-22 NOTE — Sepsis Progress Note (Signed)
eLink is following this Code Sepsis. °

## 2022-03-22 NOTE — ED Notes (Signed)
COVID swab not collected by this RN

## 2022-03-22 NOTE — H&P (Signed)
Date: 03/22/2022              Patient Name:  Christina [name] MRN: [id number]  DOB: 04-24-1941 Age / Sex: 81 y.o., female   PCP: Shirline Frees, MD         Medical Service: Internal Medicine Teaching Service         Attending Physician: Dr. Lucious Groves, DO    First Contact: Dr. Carin Primrose Pager: [phone]  Second Contact: Dr. Alfonse Spruce Pager: [phone]       After Hours (After 5p/  First Contact Pager: [phone]  weekends / holidays): Second Contact Pager: [phone]   Chief Complaint: Fall  History of Present Illness: Collateral history obtained from daughter, Silvestre Moment. Patient was in her usual state of health on Sunday, per daughter's report. They saw each other at that time. She spoke to her daughter on the phone yesterday and nothing seemed amiss. This morning, daughter states that her mom didn't call her as per usual which concerned her. She called Alfredo Bach who made contact with the patient. She was found down on the ground incontinent of urine. Transported to ED via EMS.  At the time of her interview the patient was somewhat confused, responding inappropriately to questions at times, but provided the following history:  Reports gradual onset of weakness with nausea yesterday.  She thinks that this played a role in her fall but is not sure why she fell.  She uses a walker to move around her home, usually without any trouble.  At the time of this evaluation, she complains chiefly of pain in her left shoulder.  Review of systems is positive for dysuria that she thinks began today.  She denies abdominal pain, vomiting, diarrhea, constipation, fevers, chest pain, shortness of breath, cough, headache.  Medical history notable for breast cancer s/p partial mastectomy 2018, ASCVD with history of CVA November 2013 with residual RUE weakness, type 2 DM.  ED course: Hemodynamically stable and afebrile on arrival. Laboratory workup notable for leukocytosis and lactic acidosis. UA with bacteriura,  pyuria, and microscopic hematuria. CT head wo contrast showed no ICH. CT cervical spine without fracture. CT maxillofacial without fracture. CXR normal. Xray pelvis without fracture.  Blood cultures were obtained, fluids were started, and ceftriaxone was administered.  Meds: Adherent to medications Current Meds  Medication Sig   ALPRAZolam (XANAX) 0.5 MG tablet Take 0.5 mg by mouth 3 (three) times daily as needed for anxiety.   clobetasol ointment (TEMOVATE) 0.99 % Apply 1 Application topically 2 (two) times daily.   glimepiride (AMARYL) 2 MG tablet Take 2 mg by mouth daily before breakfast.   hydrochlorothiazide (HYDRODIURIL) 12.5 MG tablet Take 12.5 mg by mouth daily.   irbesartan (AVAPRO) 300 MG tablet Take 300 mg by mouth daily.   metFORMIN (GLUCOPHAGE-XR) 500 MG 24 hr tablet Take 500 mg by mouth daily.   pantoprazole (PROTONIX) 40 MG tablet Take 40 mg by mouth daily.   pravastatin (PRAVACHOL) 20 MG tablet Take 20 mg by mouth daily.   traMADol (ULTRAM) 50 MG tablet Take 50 mg by mouth daily as needed for pain.     Allergies: Allergies as of 03/22/2022 - Review Complete 03/22/2022  Allergen Reaction Noted   Ciprofloxacin  04/29/2012   Citalopram  04/29/2012   Codeine  04/29/2012   Maxzide [hydrochlorothiazide w-triamterene]  04/29/2012   Septra ds [sulfamethoxazole-trimethoprim] Rash 03/31/2014   Past Medical History:  Diagnosis Date   Anxiety    Breast cancer, right ([place])  DDD (degenerative disc disease), lumbar    w spinal stenosis   Diabetes mellitus without complication (HCC)    Fibromyalgia    GERD (gastroesophageal reflux disease)    H/O: CVA (cerebrovascular accident) 04/2012   Hemorrhoids    Hyperlipidemia    Hypertension    Ovarian cystic mass 11/14   R, nl CA-125, followed by Dr Deatra Ina.    Family History: Heart problems in mother.  Lung cancer in father.  Social History: Lives in [facility].  Has a cat.  Enjoys a high level of  independence, but her daughter helps her with her finances.  Review of Systems: A complete ROS was negative except as per HPI.   Physical Exam: Blood pressure (!) 98/56, pulse 91, temperature 98.3 F (36.8 C), temperature source Oral, resp. rate 15, height '5\' 3"'$  (1.6 m), weight 68 kg, SpO2 95 %. General: Alert female, laying in bed, in no acute distress.  Nontoxic-appearing. HEENT: Abrasions to chin.  Moist, pink mucous membranes.  Tongue midline. Cardiovascular: Regular rate and rhythm.  No murmurs.  Bilateral radial pulses normal. Respiratory: Normal work of breathing.  Slight bibasilar crackles. Abdominal: Suprapubic tenderness and involuntary guarding.  Soft.  Nondistended. Extremities: Impaired range of motion bilateral shoulders.  Pain on passive range of motion bilateral shoulders.  Hips and pelvis stable.  No lower extremity edema. Skin: Warm and dry.  Abrasions bilateral knees. Neuro: Alert.  Oriented to person.  Occasionally responds inappropriately to questions.  Follows commands.  No gross focal deficits.  Cranial nerves intact. Psych: Pleasant.  Appropriate mood and affect.  EKG: Normal sinus rhythm.  CXR: Normal chest x-ray. CT head and cervical spine: No signs of intracranial hemorrhage.  No signs of acute cervical fracture.  Cervical spondylosis. CT maxillofacial: No fracture. DG shoulder right and left: No fracture or dislocation. DG pelvis: No fracture or dislocation.  Labs: UA with bacteriuria, pyuria, microscopic hematuria.  Lactate 2.5, trended 2.2.  Leukocytosis with WBCs 18.9.  Assessment & Plan  Christina [name] is a 81 y.o. female with a history of breast cancer status post partial mastectomy in 2018, CVA in 2013 with residual weakness, type 2 diabetes, presents after an unwitnessed fall with confusion, found to have a UTI and admitted for sepsis and encephalopathy secondary to UTI.  Principal Problem:   Acute encephalopathy Active Problems:   Essential  hypertension, benign   DM (diabetes mellitus) ([place])   Hyperlipemia   Sepsis secondary to UTI ([place])   Unwitnessed fall   Sepsis with encephalopathy due to urinary tract infection Dysuria and suprapubic tenderness present.  Urinalysis with bacteriuria and pyuria.  Altered mental status on exam.  Elevated lactate, leukocytosis, tachycardia.  Suspect metabolic encephalopathy in the setting of UTI.  Status post 2 L of LR and a dose of ceftriaxone. - Continue LR IV infusion 150 mL/h overnight. - Ceftriaxone 1 g IV every 24 hours - Acetaminophen 650 mg every 6 hours as needed for mild pain or fever greater than 101 F - Follow blood and urine cultures - Morning CBC and BMP  Unwitnessed fall with prolonged time down Physical exam and scans have been reassuring, do not suspect fractures or dislocations.  Prolonged downtime resulting in mild rhabdomyolysis.  Suspect the fall is secondary to metabolic encephalopathy in the setting of urosepsis.  Low index of suspicion for neurologic etiology as the patient's deficits are more global rather than focal.  Patient has history of diabetes and takes sulfonylurea, but she is hyperglycemic on  presentation to ED after prolonged time down, thus I do not suspect hypoglycemia.  Cannot rule out medications as a contributing factor as she is on a home regimen of Xanax for anxiety and 2 antihypertensives despite being normotensive on arrival to the hospital. - Repeat CK tomorrow - Consult PT/OT  Diabetes, type II Takes sulfonylurea and metformin at home.  Hyperglycemic on presentation after prolonged time down.  Will obtain A1c. - CBG 4 times daily before meals and at bedtime - Start aspart SSI  Hypertension Home regimen includes HCTZ and irbesartan.  Patient is currently normotensive.  I suspect she is probably hypovolemic given her prolonged time down on the ground. - Hold home antihypertensives  ASCVD Remote history of CVA. - Continue pravastatin 20 mg  daily  Diet: Normal IVF: LR,150 mL/hr VTE: Enoxaparin Code: Full Surrogate: Daughter, Dorian Pod [phone]  Dispo: Admit patient to Inpatient with expected length of stay greater than 2 midnights.  Signed: Nani Gasser MD 03/22/2022, 5:25 PM  Pager: [phone] After 5pm on weekdays and 1pm on weekends: On Call pager: [phone]

## 2022-03-22 NOTE — ED Triage Notes (Signed)
Patient reports fell yesterday evening and was unable to get up.  Recalls events that she got dizzy and face planted.  Complains of headache, chin injury, abrasions to bilateral knees.  Complains of left clavicle pain.  Patient speech is slurred and difficulty opening and closing right hand.  Patient laid in the floor until daughter came.  Patient was saturated in foul smelling urine.  Excoriation to suprapubic region noted.

## 2022-03-22 NOTE — ED Provider Notes (Signed)
[facility] EMERGENCY DEPARTMENT Provider Note   CSN: [id number] Arrival date & time: 03/22/22  1791     History  Chief Complaint  Patient presents with   Altered Mental Status   Fall    Christina [name] is a 81 y.o. female.   Altered Mental Status Fall     81 year old female with medical history significant for HTN, HLD, DM 2, fibromyalgia, history of CVA, GERD who presents to the emergency department with confusion.  The patient was last normal on Sunday.  The patient fell overnight and landed on her face.  She resides at [facility] assisted living facility and was found down on the ground incontinent of urine.  She landed on her left shoulder and complains of pain to her left shoulder.  She denies any focal weakness, numbness.  She remembers feeling lightheaded and fell onto her face.  She endorses a headache, pain in her shoulders bilaterally, left greater than right.  She arrived AAO x3, GCS 14, ABC intact.  Home Medications Prior to Admission medications   Medication Sig Start Date End Date Taking? Authorizing Provider  ALPRAZolam Duanne Moron) 0.5 MG tablet Take 0.5 mg by mouth 3 (three) times daily as needed for anxiety.   Yes [provider]  clobetasol ointment (TEMOVATE) 5.05 % Apply 1 Application topically 2 (two) times daily. 12/28/21  Yes [provider]  glimepiride (AMARYL) 2 MG tablet Take 2 mg by mouth daily before breakfast.   Yes [provider]  hydrochlorothiazide (HYDRODIURIL) 12.5 MG tablet Take 12.5 mg by mouth daily.   Yes [provider]  irbesartan (AVAPRO) 300 MG tablet Take 300 mg by mouth daily. 04/01/17  Yes [provider]  metFORMIN (GLUCOPHAGE-XR) 500 MG 24 hr tablet Take 500 mg by mouth daily. 09/13/12  Yes [provider]  pantoprazole (PROTONIX) 40 MG tablet Take 40 mg by mouth daily. 12/28/21  Yes [provider]  pravastatin (PRAVACHOL) 20 MG tablet Take 20 mg by mouth  daily.   Yes [provider]  traMADol (ULTRAM) 50 MG tablet Take 50 mg by mouth daily as needed for pain. 02/28/22  Yes [provider]  aspirin 81 MG tablet Take 81 mg by mouth daily.    [provider]      Allergies    Ciprofloxacin, Citalopram, Codeine, Maxzide [hydrochlorothiazide w-triamterene], and Septra ds [sulfamethoxazole-trimethoprim]    Review of Systems   Review of Systems  Unable to perform ROS: Mental status change    Physical Exam Updated Vital Signs BP 107/76   Pulse 94   Temp 98.3 F (36.8 C) (Oral)   Resp 13   Ht '5\' 3"'$  (1.6 m)   Wt 68 kg   SpO2 96%   BMI 26.57 kg/m  Physical Exam Vitals and nursing note reviewed.  Constitutional:      General: She is not in acute distress.    Appearance: She is well-developed.     Comments: GCS 14, ABC intact  HENT:     Head: Normocephalic.     Comments: Tenderness to palpation of the left jaw Eyes:     Extraocular Movements: Extraocular movements intact.     Conjunctiva/sclera: Conjunctivae normal.     Pupils: Pupils are equal, round, and reactive to light.  Neck:     Comments: No midline tenderness to palpation of the cervical spine.  Range of motion intact Cardiovascular:     Rate and Rhythm: Normal rate and regular rhythm.  Pulmonary:  Effort: Pulmonary effort is normal. No respiratory distress.     Breath sounds: Normal breath sounds.  Chest:     Comments: Clavicles stable nontender to AP compression.  Chest wall stable and nontender to AP and lateral compression. Abdominal:     Palpations: Abdomen is soft.     Tenderness: There is no abdominal tenderness.     Comments: Pelvis stable to lateral compression  Musculoskeletal:     Cervical back: Neck supple.     Comments: No midline tenderness to palpation of the thoracic or lumbar spine.  Extremities atraumatic with intact range of motion with the exception of bilateral tenderness to palpation of the shoulders, distally  neurovascularly intact.  Skin:    General: Skin is warm and dry.  Neurological:     Mental Status: She is alert.     Comments: Cranial nerves II through XII grossly intact.  Moving all 4 extremities spontaneously.  Sensation grossly intact all 4 extremities     ED Results / Procedures / Treatments   Labs (all labs ordered are listed, but only abnormal results are displayed) Labs Reviewed  COMPREHENSIVE METABOLIC PANEL - Abnormal; Notable for the following components:      Result Value   Glucose, Bld 193 (*)    BUN 25 (*)    AST 112 (*)    GFR, Estimated 60 (*)    All other components within normal limits  LACTIC ACID, PLASMA - Abnormal; Notable for the following components:   Lactic Acid, Venous 2.5 (*)    All other components within normal limits  LACTIC ACID, PLASMA - Abnormal; Notable for the following components:   Lactic Acid, Venous 2.2 (*)    All other components within normal limits  CBC WITH DIFFERENTIAL/PLATELET - Abnormal; Notable for the following components:   WBC 18.9 (*)    RBC 5.21 (*)    Hemoglobin 16.0 (*)    HCT 47.6 (*)    Neutro Abs 16.9 (*)    Abs Immature Granulocytes 0.10 (*)    All other components within normal limits  URINALYSIS, ROUTINE W REFLEX MICROSCOPIC - Abnormal; Notable for the following components:   Color, Urine AMBER (*)    APPearance CLOUDY (*)    Hgb urine dipstick LARGE (*)    Ketones, ur 5 (*)    Protein, ur 30 (*)    Leukocytes,Ua LARGE (*)    WBC, UA >50 (*)    Bacteria, UA MANY (*)    All other components within normal limits  CK - Abnormal; Notable for the following components:   Total CK 3,928 (*)    All other components within normal limits  CBG MONITORING, ED - Abnormal; Notable for the following components:   Glucose-Capillary 177 (*)    All other components within normal limits  SARS CORONAVIRUS 2 BY RT PCR  CULTURE, BLOOD (ROUTINE X 2)  CULTURE, BLOOD (ROUTINE X 2)  URINE CULTURE  PROTIME-INR  BASIC METABOLIC  PANEL  CBC  HEMOGLOBIN A1C  CK  CBG MONITORING, ED    EKG EKG Interpretation  Date/Time:  Wednesday March 22 2022 10:36:46 EDT Ventricular Rate:  89 PR Interval:  161 QRS Duration: 81 QT Interval:  397 QTC Calculation: 484 R Axis:   -55 Text Interpretation: Sinus rhythm Left anterior fascicular block Abnormal R-wave progression, early transition Confirmed by Regan Lemming ([id number]) on 03/22/2022 11:17:21 AM  Radiology DG Shoulder Left  Result Date: 03/22/2022 CLINICAL DATA:  Fall. EXAM: LEFT SHOULDER - 2+ VIEW  COMPARISON:  None Available. FINDINGS: No acute fracture or dislocation is identified. There is mild acromioclavicular osteoarthrosis. Mild narrowing of the acromial humeral interval may reflect rotator cuff tendinopathy. IMPRESSION: No acute osseous abnormality identified. Electronically Signed   By: Logan Bores M.D.   On: 03/22/2022 15:22   DG Shoulder Right  Result Date: 03/22/2022 CLINICAL DATA:  Fall. EXAM: RIGHT SHOULDER - 2+ VIEW COMPARISON:  None Available. FINDINGS: No acute fracture or dislocation is identified. There is mild acromioclavicular osteoarthrosis. Mild narrowing of the acromial humeral interval may reflect rotator cuff tendinopathy. IMPRESSION: No acute osseous abnormality identified. Electronically Signed   By: Logan Bores M.D.   On: 03/22/2022 15:21   CT Maxillofacial Wo Contrast  Result Date: 03/22/2022 CLINICAL DATA:  Trauma EXAM: CT MAXILLOFACIAL WITHOUT CONTRAST TECHNIQUE: Multidetector CT imaging of the maxillofacial structures was performed. Multiplanar CT image reconstructions were also generated. RADIATION DOSE REDUCTION: This exam was performed according to the departmental dose-optimization program which includes automated exposure control, adjustment of the mA and/or kV according to patient size and/or use of iterative reconstruction technique. COMPARISON:  None Available. FINDINGS: Osseous: No fracture or mandibular dislocation. No destructive  process. Degenerative changes of bilateral TMJs. Orbits: Negative. No traumatic or inflammatory finding. Bilateral lens replacements. Sinuses: Mild bilateral maxillary sinus mucosal thickening. Soft tissues: Mild soft tissue stranding along the chin. Impacted cerumen in the right EAC. Limited intracranial: No significant or unexpected finding. IMPRESSION: 1. No acute facial fracture. 2. Mild soft tissue stranding along the chin. Electronically Signed   By: Marin Roberts M.D.   On: 03/22/2022 15:00   CT Head Wo Contrast  Result Date: 03/22/2022 CLINICAL DATA:  Patient reports fell yesterday evening and was unable to get up. EXAM: CT HEAD WITHOUT CONTRAST CT CERVICAL SPINE WITHOUT CONTRAST TECHNIQUE: Multidetector CT imaging of the head and cervical spine was performed following the standard protocol without intravenous contrast. Multiplanar CT image reconstructions of the cervical spine were also generated. RADIATION DOSE REDUCTION: This exam was performed according to the departmental dose-optimization program which includes automated exposure control, adjustment of the mA and/or kV according to patient size and/or use of iterative reconstruction technique. COMPARISON:  None Available. FINDINGS: CT HEAD FINDINGS Brain: No evidence of acute infarction, hemorrhage, extra-axial collection, ventriculomegaly, or mass effect. Generalized cerebral atrophy. Periventricular white matter low attenuation likely secondary to microangiopathy. Vascular: Cerebrovascular atherosclerotic calcifications are noted. No hyperdense vessels. Skull: Negative for fracture or focal lesion. Sinuses/Orbits: Visualized portions of the orbits are unremarkable. Visualized portions of the paranasal sinuses are unremarkable. Visualized portions of the mastoid air cells are unremarkable. Other: None. CT CERVICAL SPINE FINDINGS Alignment: 4 mm anterolisthesis of C4 on C5. Skull base and vertebrae: No acute fracture. No primary bone lesion or  focal pathologic process. Soft tissues and spinal canal: No prevertebral fluid or swelling. No visible canal hematoma. Disc levels: Ankylosis of the vertebral bodies and posterior elements from C2 through C4. Degenerative disease with disc height loss at C4-5, C5-6, C6-7, C7-T1, T1-2 and T2-3. Bilateral facet arthropathy from C4-5 through T1-2. Mild broad-based disc osteophyte complex at C5-6 and C6-7 with bilateral uncovertebral degenerative changes and foraminal encroachment. Upper chest: Lung apices are clear. Other: No fluid collection or hematoma. Thoracic aortic atherosclerosis. Bilateral carotid artery atherosclerosis. 9 mm right thyroid nodule for which no further evaluation is recommended at this time. IMPRESSION: 1. No acute intracranial pathology. 2.  No acute osseous injury of the cervical spine. 3. Cervical spine spondylosis as described above. 4.  Bilateral carotid artery atherosclerosis. 5.  Aortic Atherosclerosis (ICD10-I70.0). Electronically Signed   By: Kathreen Devoid M.D.   On: 03/22/2022 10:54   CT Cervical Spine Wo Contrast  Result Date: 03/22/2022 CLINICAL DATA:  Patient reports fell yesterday evening and was unable to get up. EXAM: CT HEAD WITHOUT CONTRAST CT CERVICAL SPINE WITHOUT CONTRAST TECHNIQUE: Multidetector CT imaging of the head and cervical spine was performed following the standard protocol without intravenous contrast. Multiplanar CT image reconstructions of the cervical spine were also generated. RADIATION DOSE REDUCTION: This exam was performed according to the departmental dose-optimization program which includes automated exposure control, adjustment of the mA and/or kV according to patient size and/or use of iterative reconstruction technique. COMPARISON:  None Available. FINDINGS: CT HEAD FINDINGS Brain: No evidence of acute infarction, hemorrhage, extra-axial collection, ventriculomegaly, or mass effect. Generalized cerebral atrophy. Periventricular white matter low  attenuation likely secondary to microangiopathy. Vascular: Cerebrovascular atherosclerotic calcifications are noted. No hyperdense vessels. Skull: Negative for fracture or focal lesion. Sinuses/Orbits: Visualized portions of the orbits are unremarkable. Visualized portions of the paranasal sinuses are unremarkable. Visualized portions of the mastoid air cells are unremarkable. Other: None. CT CERVICAL SPINE FINDINGS Alignment: 4 mm anterolisthesis of C4 on C5. Skull base and vertebrae: No acute fracture. No primary bone lesion or focal pathologic process. Soft tissues and spinal canal: No prevertebral fluid or swelling. No visible canal hematoma. Disc levels: Ankylosis of the vertebral bodies and posterior elements from C2 through C4. Degenerative disease with disc height loss at C4-5, C5-6, C6-7, C7-T1, T1-2 and T2-3. Bilateral facet arthropathy from C4-5 through T1-2. Mild broad-based disc osteophyte complex at C5-6 and C6-7 with bilateral uncovertebral degenerative changes and foraminal encroachment. Upper chest: Lung apices are clear. Other: No fluid collection or hematoma. Thoracic aortic atherosclerosis. Bilateral carotid artery atherosclerosis. 9 mm right thyroid nodule for which no further evaluation is recommended at this time. IMPRESSION: 1. No acute intracranial pathology. 2.  No acute osseous injury of the cervical spine. 3. Cervical spine spondylosis as described above. 4. Bilateral carotid artery atherosclerosis. 5.  Aortic Atherosclerosis (ICD10-I70.0). Electronically Signed   By: Kathreen Devoid M.D.   On: 03/22/2022 10:54   DG Chest Portable 1 View  Result Date: 03/22/2022 CLINICAL DATA:  Altered mental status. EXAM: PORTABLE CHEST 1 VIEW COMPARISON:  Two view chest x-ray 05/20/2016 FINDINGS: The heart size is normal. Atherosclerotic calcifications are present at the aortic arch. Lungs are clear. No edema or effusion is present. No focal airspace disease is present. Degenerative changes are present  both shoulders. Surgical clips are present in the right breast. IMPRESSION: 1. No acute cardiopulmonary disease. 2. Aortic atherosclerosis. Electronically Signed   By: San Morelle M.D.   On: 03/22/2022 10:37   DG Pelvis Portable  Result Date: 03/22/2022 CLINICAL DATA:  Hip pain status post fall EXAM: PORTABLE PELVIS 1-2 VIEWS COMPARISON:  None Available. FINDINGS: No acute fracture or dislocation. No aggressive osseous lesion. Normal alignment. Generalized osteopenia. Lower lumbar spine spondylosis partially visualized. Soft tissue are unremarkable. No radiopaque foreign body or soft tissue emphysema. IMPRESSION: No acute osseous injury of the pelvis. Given the patient's age and osteopenia, if there is persistent clinical concern for an occult hip fracture, a MRI of the hip is recommended for increased sensitivity. Electronically Signed   By: Kathreen Devoid M.D.   On: 03/22/2022 10:36    Procedures .Critical Care  Performed by: Regan Lemming, MD Authorized by: Regan Lemming, MD   Critical care provider statement:  Critical care time (minutes):  30   Critical care was necessary to treat or prevent imminent or life-threatening deterioration of the following conditions:  Sepsis   Critical care was time spent personally by me on the following activities:  Development of treatment plan with patient or surrogate, discussions with consultants, evaluation of patient's response to treatment, examination of patient, ordering and review of laboratory studies, ordering and review of radiographic studies, ordering and performing treatments and interventions, pulse oximetry, re-evaluation of patient's condition and review of old charts   Care discussed with: admitting provider       Medications Ordered in ED Medications  lactated ringers infusion ( Intravenous New Bag/Given 03/22/22 1402)  pravastatin (PRAVACHOL) tablet 20 mg (20 mg Oral Given 03/22/22 1654)  enoxaparin (LOVENOX) injection 40 mg  (40 mg Subcutaneous Given 03/22/22 1659)  acetaminophen (TYLENOL) tablet 650 mg (has no administration in time range)    Or  acetaminophen (TYLENOL) suppository 650 mg (has no administration in time range)  polyethylene glycol (MIRALAX / GLYCOLAX) packet 17 g (has no administration in time range)  cefTRIAXone (ROCEPHIN) 1 g in sodium chloride 0.9 % 100 mL IVPB (has no administration in time range)  insulin aspart (novoLOG) injection 0-15 Units ( Subcutaneous Not Given 03/22/22 1712)  lactated ringers bolus 1,000 mL (0 mLs Intravenous Stopped 03/22/22 1317)  cefTRIAXone (ROCEPHIN) 1 g in sodium chloride 0.9 % 100 mL IVPB (0 g Intravenous Stopped 03/22/22 1437)  lactated ringers bolus 1,000 mL (0 mLs Intravenous Stopped 03/22/22 1542)    ED Course/ Medical Decision Making/ A&P Clinical Course as of 03/22/22 1946  Wed Mar 22, 2022  1344 Leukocytes,Ua(!): LARGE [JL]  1344 WBC, UA(!): >50 [JL]  1344 Bacteria, UA(!): MANY [JL]  1528 AMS UTI [CC]    Clinical Course User Index [CC] Tretha Sciara, MD [JL] Regan Lemming, MD                           Medical Decision Making Amount and/or Complexity of Data Reviewed Labs: ordered. Decision-making details documented in ED Course. Radiology: ordered.  Risk Decision regarding hospitalization.   81 year old female with medical history significant for HTN, HLD, DM 2, fibromyalgia, history of CVA, GERD who presents to the emergency department with confusion.  The patient was last normal on Sunday.  The patient fell overnight and landed on her face.  She resides at [facility] assisted [facility] facility and was found down on the ground incontinent of urine.  She landed on her left shoulder and complains of pain to her left shoulder.  She denies any focal weakness, numbness.  She remembers feeling lightheaded and fell onto her face.  She endorses a headache, pain in her shoulders bilaterally, left greater than right.  She arrived AAO x3, GCS 14, ABC  intact.  On arrival, the patient was afebrile, temperature 98.5, mildly elevated heart rate P94, not tachypneic RR 20, hemodynamically stable BP 115/67, saturating 99% on room air.  Patient presenting after fall with altered mental status, differential diagnosis includes ICH, CVA, electrolyte derangement, toxic metabolic encephalopathy, sepsis (source urinary tract versus respiratory), no lower extremity weakness or mid thoracic back pain to suggest cauda equina, no focal deficits to suggest active CVA.  Considered hypoglycemia and syncope.  Patient with increased urinary frequency, altered mental status raising concern for UTI.  Trauma imaging to include CT of the head and cervical spine without acute fracture or malalignment, no acute intracranial abnormality noted.  IMPRESSION: 1. No acute intracranial pathology. 2.  No acute osseous injury of the cervical spine. 3. Cervical spine spondylosis as described above. 4. Bilateral carotid artery atherosclerosis. 5.  Aortic Atherosclerosis (ICD10-I70.0).  CT maxillofacial without contrast revealed no acute facial fracture, mild soft tissue stranding about the chin.  X-ray imaging of the shoulders bilaterally revealed no acute fracture or dislocation.  Chest x-ray revealed no evidence of pneumothorax or other acute traumatic injury intrathoracic leak, no acute cardiopulmonary disease noted.  Pelvic x-ray revealed no evidence of pelvic fracture, pelvic ring intact.  IMPRESSION: No acute osseous injury of the pelvis. Given the patient's age and osteopenia, if there is persistent clinical concern for an occult hip fracture, a MRI of the hip is recommended for increased sensitivity.   Patient laboratory evaluation significant for urinalysis positive for UTI with large leukocytes, greater than 50 WBCs, many bacteria present, CBC with leukocytosis to 18.9, evidence of hemoconcentration with a hemoglobin of 16, INR normal, CMP without significant  electrolyte derangement, blood glucose 193, normal bicarbonate and anion gap, COVID-19 PCR testing negative, initial CK concerning for mild rhabdo my lysis with a CK of 3928, initial lactic acid elevated to 2.5, downtrending to 2.2 after fluid resuscitation.  Code sepsis initiated due to the patient urinary tract infection, leukocytosis and elevated heart rate on arrival.  The patient was administered IV fluids and covered with IV Rocephin.  On repeat examination, the patient appeared to have improved volume status.  Her lactic acidosis was downtrending following fluids.  She remains mildly encephalopathic likely due to UTI.  Due to concern for sepsis, altered mental status secondary likely to urinary tract infection, internal medicine was consulted for admission.     Final Clinical Impression(s) / ED Diagnoses Final diagnoses:  Altered mental status, unspecified altered mental status type  Sepsis, due to unspecified organism, unspecified whether acute organ dysfunction present [facility])  Acute cystitis with hematuria  Traumatic rhabdomyolysis, initial encounter [facility])    Rx / DC Orders ED Discharge Orders     None         Regan Lemming, MD 03/22/22 1948

## 2022-03-22 NOTE — Hospital Course (Addendum)
Discharge items: SNF Rolling walker (2 wheels) BSC/3in1  DC instructions completed, med-rec and orders pended, DC summary pended.   10-7: Patient reports some diarrhea, but is otherwise feeling good. Her mental fogginess has resolved and she continues to urinate frequently without pain. Discussed plan of switching from intravenous to oral antibiotic regimen before discharge. - Prescribed loperamide 2g q24

## 2022-03-23 DIAGNOSIS — I2510 Atherosclerotic heart disease of native coronary artery without angina pectoris: Secondary | ICD-10-CM | POA: Diagnosis present

## 2022-03-23 DIAGNOSIS — E872 Acidosis, unspecified: Secondary | ICD-10-CM | POA: Diagnosis present

## 2022-03-23 DIAGNOSIS — G9340 Encephalopathy, unspecified: Secondary | ICD-10-CM

## 2022-03-23 DIAGNOSIS — G9349 Other encephalopathy: Secondary | ICD-10-CM | POA: Diagnosis present

## 2022-03-23 DIAGNOSIS — G9341 Metabolic encephalopathy: Secondary | ICD-10-CM | POA: Diagnosis present

## 2022-03-23 DIAGNOSIS — N390 Urinary tract infection, site not specified: Secondary | ICD-10-CM

## 2022-03-23 DIAGNOSIS — W19XXXA Unspecified fall, initial encounter: Secondary | ICD-10-CM | POA: Diagnosis present

## 2022-03-23 DIAGNOSIS — Z20822 Contact with and (suspected) exposure to covid-19: Secondary | ICD-10-CM | POA: Diagnosis present

## 2022-03-23 DIAGNOSIS — E876 Hypokalemia: Secondary | ICD-10-CM | POA: Diagnosis present

## 2022-03-23 DIAGNOSIS — L8990 Pressure ulcer of unspecified site, unspecified stage: Secondary | ICD-10-CM | POA: Insufficient documentation

## 2022-03-23 DIAGNOSIS — M25511 Pain in right shoulder: Secondary | ICD-10-CM | POA: Diagnosis present

## 2022-03-23 DIAGNOSIS — R6520 Severe sepsis without septic shock: Secondary | ICD-10-CM | POA: Diagnosis present

## 2022-03-23 DIAGNOSIS — Z1611 Resistance to penicillins: Secondary | ICD-10-CM | POA: Diagnosis present

## 2022-03-23 DIAGNOSIS — M25561 Pain in right knee: Secondary | ICD-10-CM | POA: Diagnosis present

## 2022-03-23 DIAGNOSIS — I69331 Monoplegia of upper limb following cerebral infarction affecting right dominant side: Secondary | ICD-10-CM | POA: Diagnosis not present

## 2022-03-23 DIAGNOSIS — M25562 Pain in left knee: Secondary | ICD-10-CM | POA: Diagnosis present

## 2022-03-23 DIAGNOSIS — E1165 Type 2 diabetes mellitus with hyperglycemia: Secondary | ICD-10-CM | POA: Diagnosis present

## 2022-03-23 DIAGNOSIS — A419 Sepsis, unspecified organism: Secondary | ICD-10-CM | POA: Diagnosis not present

## 2022-03-23 DIAGNOSIS — T796XXA Traumatic ischemia of muscle, initial encounter: Secondary | ICD-10-CM | POA: Diagnosis present

## 2022-03-23 DIAGNOSIS — M25512 Pain in left shoulder: Secondary | ICD-10-CM | POA: Diagnosis present

## 2022-03-23 DIAGNOSIS — I10 Essential (primary) hypertension: Secondary | ICD-10-CM | POA: Diagnosis present

## 2022-03-23 DIAGNOSIS — K219 Gastro-esophageal reflux disease without esophagitis: Secondary | ICD-10-CM | POA: Diagnosis present

## 2022-03-23 DIAGNOSIS — M5136 Other intervertebral disc degeneration, lumbar region: Secondary | ICD-10-CM | POA: Diagnosis present

## 2022-03-23 DIAGNOSIS — B961 Klebsiella pneumoniae [K. pneumoniae] as the cause of diseases classified elsewhere: Secondary | ICD-10-CM | POA: Diagnosis not present

## 2022-03-23 DIAGNOSIS — J150 Pneumonia due to Klebsiella pneumoniae: Secondary | ICD-10-CM | POA: Diagnosis present

## 2022-03-23 DIAGNOSIS — L89152 Pressure ulcer of sacral region, stage 2: Secondary | ICD-10-CM | POA: Diagnosis present

## 2022-03-23 DIAGNOSIS — A4159 Other Gram-negative sepsis: Secondary | ICD-10-CM | POA: Diagnosis present

## 2022-03-23 DIAGNOSIS — R4182 Altered mental status, unspecified: Secondary | ICD-10-CM | POA: Insufficient documentation

## 2022-03-23 DIAGNOSIS — N3001 Acute cystitis with hematuria: Secondary | ICD-10-CM | POA: Diagnosis present

## 2022-03-23 DIAGNOSIS — E785 Hyperlipidemia, unspecified: Secondary | ICD-10-CM | POA: Diagnosis present

## 2022-03-23 DIAGNOSIS — M47812 Spondylosis without myelopathy or radiculopathy, cervical region: Secondary | ICD-10-CM | POA: Diagnosis present

## 2022-03-23 LAB — BASIC METABOLIC PANEL
Anion gap: 8 (ref 5–15)
BUN: 17 mg/dL (ref 8–23)
CO2: 27 mmol/L (ref 22–32)
Calcium: 8.2 mg/dL — ABNORMAL LOW (ref 8.9–10.3)
Chloride: 106 mmol/L (ref 98–111)
Creatinine, Ser: 0.67 mg/dL (ref 0.44–1.00)
GFR, Estimated: >60 mL/min (ref >60)
Glucose, Bld: 110 mg/dL — ABNORMAL HIGH (ref 70–99)
Potassium: 3.4 mmol/L — ABNORMAL LOW (ref 3.5–5.1)
Sodium: 141 mmol/L (ref 135–145)

## 2022-03-23 LAB — GLUCOSE, CAPILLARY
Glucose-Capillary: 121 mg/dL — ABNORMAL HIGH (ref 70–99)
Glucose-Capillary: 94 mg/dL (ref 70–99)
Glucose-Capillary: 126 mg/dL — ABNORMAL HIGH (ref 70–99)

## 2022-03-23 LAB — CBC
HCT: 39.2 % (ref 36.0–46.0)
Hemoglobin: 13 g/dL (ref 12.0–15.0)
MCH: 30.8 pg (ref 26.0–34.0)
MCHC: 33.2 g/dL (ref 30.0–36.0)
MCV: 92.9 fL (ref 80.0–100.0)
Platelets: 251 10*3/uL (ref 150–400)
RBC: 4.22 MIL/uL (ref 3.87–5.11)
RDW: 13 % (ref 11.5–15.5)
WBC: 13.9 10*3/uL — ABNORMAL HIGH (ref 4.0–10.5)
nRBC: 0 % (ref 0.0–0.2)

## 2022-03-23 LAB — CBG MONITORING, ED: Glucose-Capillary: 107 mg/dL — ABNORMAL HIGH (ref 70–99)

## 2022-03-23 LAB — CK: Total CK: 2087 U/L — ABNORMAL HIGH (ref 38–234)

## 2022-03-23 LAB — HEMOGLOBIN A1C
Hgb A1c MFr Bld: 6.4 % — ABNORMAL HIGH (ref 4.8–5.6)
Mean Plasma Glucose: 136.98 mg/dL

## 2022-03-23 MED ORDER — DICLOFENAC SODIUM 1 % EX GEL
4.0000 g | Freq: Four times a day (QID) | CUTANEOUS | Status: DC | PRN
  Filled 2022-03-23: qty 100

## 2022-03-23 MED ORDER — POTASSIUM CHLORIDE CRYS ER 20 MEQ PO TBCR
40.0000 meq | EXTENDED_RELEASE_TABLET | Freq: Once | ORAL | Status: AC
  Administered 2022-03-23: 40 meq via ORAL
  Filled 2022-03-23: qty 2

## 2022-03-23 NOTE — TOC Initial Note (Signed)
Transition of Care [facility]) - Initial/Assessment Note    Patient Details  Name: Christina [name] MRN: [id number] Date of Birth: 1941/05/21  Transition of [facility]) CM/SW Contact:    Milinda Antis, [place] Phone Number: 03/23/2022, 12:53 PM  Clinical Narrative:                 CSW received consult for possible SNF placement at time of discharge. CSW spoke with patient at bedside and patient's daughter via phone. Patient reports that she recently moved to [facility] after being in her home for 41 years.  Patient expressed understanding of PT recommendation and is agreeable to SNF placement at time of discharge. Patient reports preference for a facility in [place]. CSW discussed insurance authorization process and will provide Medicare SNF ratings list. CSW will send out referrals for review and provide bed offers as available.   Skilled Nursing Rehab Facilities-   [email or web address]   Ratings out of 5 stars (the highest)   Name Address  Phone # Wisner Inspection Overall  [facility] [address], [place] '5 5 2 4  '$ [facility]  [address] [place], [place] [phone] '4 2 5 5  '$ [facility] [place], [place] '1 1 1 1  '$ [facility] [place], [place] '2 2 4 4  '$ [facility] [address] [place] '1 1 2 1  '$ [place] '3 2 4 4  '$ [facility] [address] [place] '5 1 3 3  '$ [facility] [address] [place] '5 2 3 4  '$ [address] ([facility]) [place], [place] [phone] '4 1 2 1  '$ [facility] [phone] [address], [place] [phone] '4 1 1 1  '$ [facility] [address], [facility] [phone] '3 1 2 1  '$ [facility] ([place]) [place], [place] [phone] '4 1 1 1  '$ Dustin Flock  [address] [phone] '4 2 4 4          '$ [facility] [address], [address] [phone]      [facility] [place] '4 1 3 2  '$ [facility] [place] [address], [place] '3 1 5 4  '$ [address], [place], [address] [phone] '1 1 1 1  '$ [facility] [address], [place] [phone] '2 2 3 3          '$ [address] (no [facility]) [address], [place] [phone] '5 5 5 5  '$ [facility] (No Humana) [address] [phone] [address], [place] '4 1 4 3  '$ [facility] (No UHC) [place], [place] '5 5 5 5  '$ [facility] [address], [address] [phone] '2 3 5 5  '$ [place] [address], [place] '1 1 2 1  '$ [facility] [address] [place] '3 1 1 1  '$ [place], [place] '5 2 4 5  '$ [facility]  [address], [place] '2 2 1 1  '$ [facility] [address], [place] '3 2 1 1  '$ [facility] [place], [place] '2 2 2 2          '$ [facility] [address] [place] '1 1 1 1  '$ [facility] [address] [place]  [phone] 2 4 2  2  [facility] [place] [address], [place] [phone] '4 2 3 3  '$ [facility] [address], [place] '2 1 1 1  '$ [facility] (No Humana) [address], [place] '2 2 3 3  '$ [facility] [address] [place] [phone] '2 1 1 1          '$ [facility] [place], [place] '5 4 5 5  '$ [facility] ([place])  [address], [place] '2 1 2 1  '$ [facility]) [place], [place] [phone] '3 1 4 3  '$ [facility] [address] [place] '3 3 4 4  '$ [address] [place], [place] '2 3 1 1   '$ [facility]) [place], [place] '2 1 4 3     '$ Expected Discharge Plan: [place] Barriers to Discharge: Continued Medical Work up, SNF Pending bed offer, Insurance Authorization   Patient Goals and CMS Choice Patient states their goals for this hospitalization and ongoing recovery are:: To return to [employer] Compare Post Acute Care list provided to:: Patient Choice offered to / list presented to : Patient  Expected Discharge Plan and Services Expected Discharge Plan: [place] In-house Referral: Clinical Social Work     Living arrangements for the past 2 months: [place]                                      Prior Living Arrangements/Services Living arrangements for the past 2 months: [place] Lives with:: Facility Resident Patient language and need for interpreter reviewed:: Yes Do you feel safe going back to the place where you live?: Yes      Need for Family Participation in Patient Care: Yes (Comment) Care giver support system in place?: Yes (comment)   Criminal Activity/Legal Involvement Pertinent to Current Situation/Hospitalization: No - Comment as needed  Activities of Daily Living      Permission Sought/Granted   Permission granted to share information with : Yes, Verbal Permission Granted  Share Information with NAME: Vilma Meckel (Daughter)   [phone]  Permission granted to share info w AGENCY: SNFs        Emotional Assessment Appearance:: Appears stated age Attitude/Demeanor/Rapport: Engaged Affect (typically observed): Accepting Orientation: : Oriented to Situation, Oriented to  Time, Oriented to Place, Oriented to Self Alcohol / Substance Use: Not Applicable Psych Involvement: No (comment)  Admission diagnosis:  Acute cystitis with hematuria [N30.01] Acute encephalopathy [G93.40] Traumatic  rhabdomyolysis, initial encounter ([place]) [T79.6XXA] Altered mental status, unspecified altered mental status type [R41.82] Sepsis, due to unspecified organism, unspecified whether acute organ dysfunction present [facility]) [A41.9] Patient Active Problem List   Diagnosis Date Noted   Pressure injury of skin 03/23/2022   Altered mental status    Sepsis secondary to UTI ([place]) 03/22/2022   Unwitnessed fall 03/22/2022   Acute encephalopathy 03/22/2022   Rhabdomyolysis 03/22/2022   Malignant neoplasm of upper-outer quadrant of right breast in female, estrogen receptor positive ([place]) 04/04/2017   Hyperlipemia 03/31/2014   Stroke ([place]) 12/11/2012   Essential hypertension, benign 12/11/2012   DM (diabetes mellitus) ([place]) 12/11/2012   PCP:  Shirline Frees, MD Pharmacy:   [place] [place] - [address] AT [place]  [place] [address] Phone: [phone] Fax: [phone]  [facility] [place], [place] - [address] [place] [address] Phone: [phone] Fax: [phone]     Social Determinants of Health (SDOH) Interventions    Readmission Risk Interventions     No data to display

## 2022-03-23 NOTE — NC FL2 (Signed)
[place] MEDICAID FL2 LEVEL OF CARE SCREENING TOOL     IDENTIFICATION  Patient Name: Christina [name] Birthdate: [birth year] Sex: female Admission Date (Current Location): 03/22/2022  [facility] [place] Number:  Herbalist and Address:  [facility] [place] [place] [address] [place], [place] [address]      Provider Number: [id number]  Attending Physician Name and Address:  Lucious Groves, DO  Relative Name and Phone Number:       Current Level of Care: Hospital Recommended Level of Care: [place] Prior Approval Number:    Date Approved/Denied:   PASRR Number: [id number]  Discharge Plan: SNF    Current Diagnoses: Patient Active Problem List   Diagnosis Date Noted   Pressure injury of skin 03/23/2022   Altered mental status    Sepsis secondary to UTI ([place]) 03/22/2022   Unwitnessed fall 03/22/2022   Acute encephalopathy 03/22/2022   Rhabdomyolysis 03/22/2022   Malignant neoplasm of upper-outer quadrant of right breast in female, estrogen receptor positive ([place]) 04/04/2017   Hyperlipemia 03/31/2014   Stroke ([place]) 12/11/2012   Essential hypertension, benign 12/11/2012   DM (diabetes mellitus) ([place]) 12/11/2012    Orientation RESPIRATION BLADDER Height & Weight     Self, Time, Situation, Place  Normal Continent, External catheter Weight: 150 lb (68 kg) Height:  '5\' 3"'$  (160 cm)  BEHAVIORAL SYMPTOMS/MOOD NEUROLOGICAL BOWEL NUTRITION STATUS      Continent Diet (See DC summary)  AMBULATORY STATUS COMMUNICATION OF NEEDS Skin   Extensive Assist Verbally PU Stage and Appropriate Care (Medial sacrum PI)                       Personal Care Assistance Level of Assistance  Bathing, Feeding, Dressing Bathing Assistance: Limited assistance Feeding assistance: Independent Dressing Assistance: Limited assistance     Functional Limitations Info  Sight, Hearing, Speech Sight Info: Adequate Hearing Info: Adequate Speech Info:  Adequate    SPECIAL CARE FACTORS FREQUENCY  PT (By licensed PT), OT (By licensed OT)     PT Frequency: 5x week OT Frequency: 5x week            Contractures      Additional Factors Info  Code Status, Allergies, Insulin Sliding Scale Code Status Info: Full Allergies Info: Ciprofloxacin   Citalopram   Codeine   Maxzide (Hydrochlorothiazide W-triamterene)   Septra Ds (Sulfamethoxazole-trimethoprim)   Insulin Sliding Scale Info: See DC summary       Current Medications (03/23/2022):  This is the current hospital active medication list Current Facility-Administered Medications  Medication Dose Route Frequency Provider Last Rate Last Admin   acetaminophen (TYLENOL) tablet 650 mg  650 mg Oral Q6H PRN Gaylan Gerold, DO       Or   acetaminophen (TYLENOL) suppository 650 mg  650 mg Rectal Q6H PRN Gaylan Gerold, DO       cefTRIAXone (ROCEPHIN) 1 g in sodium chloride 0.9 % 100 mL IVPB  1 g Intravenous Q24H Gaylan Gerold, DO 200 mL/hr at 03/23/22 1257 1 g at 03/23/22 1257   enoxaparin (LOVENOX) injection 40 mg  40 mg Subcutaneous Q24H Gaylan Gerold, DO   40 mg at 03/22/22 1659   insulin aspart (novoLOG) injection 0-15 Units  0-15 Units Subcutaneous TID WC Gaylan Gerold, DO   2 Units at 03/23/22 1257   polyethylene glycol (MIRALAX / GLYCOLAX) packet 17 g  17 g Oral Daily PRN Gaylan Gerold, DO  potassium chloride SA (KLOR-CON M) CR tablet 40 mEq  40 mEq Oral Once Nani Gasser, MD       pravastatin (PRAVACHOL) tablet 20 mg  20 mg Oral Daily Gaylan Gerold, DO   20 mg at 03/23/22 8592     Discharge Medications: Please see discharge summary for a list of discharge medications.  Relevant Imaging Results:  Relevant Lab Results:   Additional Information [id number]  Coralee Pesa, LCSWA

## 2022-03-23 NOTE — Evaluation (Signed)
Physical Therapy Evaluation Patient Details Name: Christina [name] MRN: [id number] DOB: 1941/02/27 Today's Date: 03/23/2022  History of Present Illness  81 y.o. female presents to [facility] hospital on 03/22/2022 after being found down on the ground, incontinent of urine. Pt reports weakness and nausea leading up to a fall. UA with concern for UTI. PMH includes breast cancer s/p partial mastectomy, CVA, DMII, fibromyalgia, HTN, HLD.  Clinical Impression  Pt presents to PT with deficits in functional mobility, gait, balance, power, strength, endurance. Pt is sore in both shoulders and BLE, limiting mobility. PT provides encouragement for AROM of joints in an effort to reduce soreness. Pt demonstrates posterior lean during most standing and with attempts at ambulation. Pt also requires physical assistance for all functional mobility tasks at this time. Pt will benefit from aggressive mobilization in an effort to reduce falls risk and to improve strength. PT recommends SNF placement at this time due to high falls risk and limited caregiver support.       Recommendations for follow up therapy are one component of a multi-disciplinary discharge planning process, led by the attending physician.  Recommendations may be updated based on patient status, additional functional criteria and insurance authorization.  Follow Up Recommendations Skilled nursing-short term rehab (<3 hours/day) Can patient physically be transported by private vehicle: Yes    Assistance Recommended at Discharge Intermittent Supervision/Assistance  Patient can return home with the following  A lot of help with walking and/or transfers;A lot of help with bathing/dressing/bathroom;Assistance with cooking/housework;Assist for transportation;Help with stairs or ramp for entrance    Equipment Recommendations Rolling walker (2 wheels);BSC/3in1  Recommendations for Other Services       Functional Status Assessment Patient has had a recent decline  in their functional status and demonstrates the ability to make significant improvements in function in a reasonable and predictable amount of time.     Precautions / Restrictions Precautions Precautions: Fall Restrictions Weight Bearing Restrictions: No      Mobility  Bed Mobility Overal bed mobility: Needs Assistance Bed Mobility: Supine to Sit, Sit to Supine     Supine to sit: Min assist Sit to supine: Mod assist   General bed mobility comments: assistance to sequence pivot of hips, physical assist to elevate LE and pivot when returning to supine    Transfers Overall transfer level: Needs assistance Equipment used: Rolling walker (2 wheels) Transfers: Sit to/from Stand, Bed to chair/wheelchair/BSC Sit to Stand: Min assist   Step pivot transfers: Min assist       General transfer comment: posterior lean, verbal cues for foot and hand placement as well as increased trunk flexion    Ambulation/Gait Ambulation/Gait assistance: Min assist Gait Distance (Feet): 2 Feet Assistive device: Rolling walker (2 wheels) Gait Pattern/deviations: Step-to pattern Gait velocity: reduced Gait velocity interpretation: <1.31 ft/sec, indicative of household ambulator   General Gait Details: pt with slowed step-to gait, reduced foot clearance, posterior lean  Stairs            Wheelchair Mobility    Modified Rankin (Stroke Patients Only)       Balance Overall balance assessment: Needs assistance Sitting-balance support: No upper extremity supported, Feet supported Sitting balance-Leahy Scale: Poor Sitting balance - Comments: minG-minA, posterior lean Postural control: Posterior lean Standing balance support: Bilateral upper extremity supported, Reliant on assistive device for balance Standing balance-Leahy Scale: Poor Standing balance comment: minA, posterior lean  Pertinent Vitals/Pain Pain Assessment Pain Assessment:  Faces Faces Pain Scale: Hurts even more Pain Location: shoulder and BLE Pain Descriptors / Indicators: Sore Pain Intervention(s): Monitored during session    Home Living Family/patient expects to be discharged to:: Other (Comment) ([facility]) Living Arrangements: Alone Available Help at Discharge: Family;Available PRN/intermittently Type of Home: Independent living facility Home Access: Level entry       Home Layout: One level Home Equipment: Rollator (4 wheels);Shower seat - built in;Grab bars - toilet;Grab bars - tub/shower      Prior Function Prior Level of Function : Independent/Modified Independent;Other (comment) (assist for driving)                     Hand Dominance        Extremity/Trunk Assessment   Upper Extremity Assessment Upper Extremity Assessment: Generalized weakness    Lower Extremity Assessment Lower Extremity Assessment: Generalized weakness    Cervical / Trunk Assessment Cervical / Trunk Assessment: Kyphotic  Communication   Communication: No difficulties  Cognition Arousal/Alertness: Awake/alert Behavior During Therapy: WFL for tasks assessed/performed Overall Cognitive Status: Within Functional Limits for tasks assessed                                          General Comments General comments (skin integrity, edema, etc.): VSS on RA, pt denies SOB, at times pleth reading desaturation but likely due to cold fingers    Exercises     Assessment/Plan    PT Assessment Patient needs continued PT services  PT Problem List Decreased strength;Decreased activity tolerance;Decreased balance;Decreased mobility;Decreased knowledge of use of DME;Decreased safety awareness;Decreased knowledge of precautions;Pain       PT Treatment Interventions DME instruction;Gait training;Functional mobility training;Therapeutic activities;Therapeutic exercise;Balance training;Neuromuscular re-education;Patient/family education     PT Goals (Current goals can be found in the Care Plan section)  Acute Rehab PT Goals Patient Stated Goal: to return to independent mobility PT Goal Formulation: With patient Time For Goal Achievement: 04/06/22 Potential to Achieve Goals: Good    Frequency Min 3X/week (for possible progression to return to ILF)     Co-evaluation               AM-PAC PT "6 Clicks" Mobility  Outcome Measure Help needed turning from your back to your side while in a flat bed without using bedrails?: A Little Help needed moving from lying on your back to sitting on the side of a flat bed without using bedrails?: A Lot Help needed moving to and from a bed to a chair (including a wheelchair)?: A Little Help needed standing up from a chair using your arms (e.g., wheelchair or bedside chair)?: A Little Help needed to walk in hospital room?: Total Help needed climbing 3-5 steps with a railing? : Total 6 Click Score: 13    End of Session   Activity Tolerance: Patient tolerated treatment well Patient left: in bed;with call bell/phone within reach;with bed alarm set Nurse Communication: Mobility status PT Visit Diagnosis: Other abnormalities of gait and mobility (R26.89);Muscle weakness (generalized) (M62.81)    Time: 1275-1700 PT Time Calculation (min) (ACUTE ONLY): 34 min   Charges:   PT Evaluation $PT Eval Low Complexity: Pratt, PT, DPT Acute Rehabilitation Office [phone]   Zenaida Niece 03/23/2022, 10:31 AM

## 2022-03-23 NOTE — Progress Notes (Signed)
Subjective:   Hospital day 0.  Interval events: None.  Daughter present at bedside for interview, able to give some collateral history.  She reports her mother's confusion has significantly improved, but that she is not back to her baseline yet.  Patient reports feeling better this morning and was eating breakfast when treatment team arrived. Confusion has improved significantly since yesterday afternoon. Overnight, nursing staff noted a pressure ulcer on her sacrum. She has still been experiencing some pain with urination. Denies abdominal pain, nausea, and vomiting. Her shoulders are still painful with movement and range of motion is limited. Explained that a urinary tract infection likely caused her confusion and recent fall. Discussed plan to continue antibiotics and fluid repletion. Patient expressed understanding of and agreement with this plan.  Objective:  Vital signs: Blood pressure (!) 102/55, pulse 84, temperature 98.3 F (36.8 C), temperature source Oral, resp. rate 16, height '5\' 3"'$  (1.6 m), weight 68 kg, SpO2 95 %.  Physical exam: Constitutional: Alert female nontoxic appearing.  No acute distress. Cardiovascular: Regular rate and rhythm. Pulmonary: Normal work of breathing.  No crackles. Abdominal: Suprapubic tenderness. Skin: Warm and dry. Extremities: Impaired range of motion bilateral shoulders.  Pain on passive range of motion bilateral shoulders. Neuro: Alert.  Oriented to person and setting (emergency department), but not hospital ([facility]) or city.   Intake/Output Summary (Last 24 hours) at 03/23/2022 1056 Last data filed at 03/22/2022 1542 Gross per 24 hour  Intake 1100 ml  Output --  Net 1100 ml    Pertinent Labs:    Latest Ref Rng & Units 03/23/2022    3:14 AM 03/22/2022   10:05 AM 05/20/2016    8:31 AM  CBC  WBC 4.0 - 10.5 K/uL 13.9  18.9  9.5   Hemoglobin 12.0 - 15.0 g/dL 13.0  16.0  14.9   Hematocrit 36.0 - 46.0 % 39.2  47.6  43.2    Platelets 150 - 400 K/uL 251  332         Latest Ref Rng & Units 03/23/2022    3:14 AM 03/22/2022   10:05 AM 04/03/2017    3:04 PM  CMP  Glucose 70 - 99 mg/dL 110  193  208   BUN 8 - 23 mg/dL '17  25  13   '$ Creatinine 0.44 - 1.00 mg/dL 0.67  0.95  0.66   Sodium 135 - 145 mmol/L 141  138  136   Potassium 3.5 - 5.1 mmol/L 3.4  3.6  4.4   Chloride 98 - 111 mmol/L 106  101  101   CO2 22 - 32 mmol/L '27  24  25   '$ Calcium 8.9 - 10.3 mg/dL 8.2  9.2  9.6   Total Protein 6.5 - 8.1 g/dL  6.8    Total Bilirubin 0.3 - 1.2 mg/dL  1.1    Alkaline Phos 38 - 126 U/L  55    AST 15 - 41 U/L  112    ALT 0 - 44 U/L  41     Blood cultures x24 hours  Urine culture >= 100,000 colonies gram-negative rods  Assessment/Plan:   Principal Problem:   Sepsis secondary to UTI [facility]) Active Problems:   Essential hypertension, benign   DM (diabetes mellitus) ([place])   Hyperlipemia   Unwitnessed fall   Acute encephalopathy   Rhabdomyolysis   Pressure injury of skin   Patient Summary: Christina  [name] is a 81 y.o. female with a history of breast cancer status post partial mastectomy in 2018, CVA in 2013 with residual weakness, type 2 diabetes, presents after an unwitnessed fall with confusion, found to have a UTI and admitted for sepsis and encephalopathy secondary to UTI.  Her condition is improving.  Sepsis with encephalopathy due to urinary tract infection, improving Still with symptoms of UTI including dysuria and suprapubic tenderness.  Mental status has improved significantly since yesterday, but still has not returned to baseline.  Afebrile and hemodynamically stable.  1 more day of IV antibiotics, and if she continues to do well likely discharge to SNF tomorrow. - Continue ceftriaxone - Continue Tylenol as needed for pain and fever - Follow-up urine culture susceptibilities and blood cultures  Unwitnessed fall with prolonged time down Due to septic/metabolic encephalopathy.  Still with bilateral  shoulder pain.  CK downtrending.  No AKI.  Electrolytes within normal limits except for marginal hypokalemia.  PT/OT recommending SNF on discharge, patient is amenable to this plan.  Type 2 diabetes, well controlled Hemoglobin A1c 6.4%.  Currently holding metformin and sulfonylurea. - Aspart SSI  Hypertension Holding home HCTZ and irbesartan.  ASCVD Remote history of CVA. - Continue pravastatin 20 mg daily  Diet: Normal IVF: None VTE: Enoxaparin Code: Full PT/OT recs: SNF for Subacute PT, bedside commode and walker. TOC recs: SNF authorization pending Family Update: Daughter at bedside  Dispo: Anticipated discharge to Skilled nursing facility in 1 days pending continued treatment of UTI.   Nani Gasser MD 03/23/2022, 10:56 AM  Pager: [phone] After 5pm on weekdays and 1pm on weekends: On Call pager: [phone]

## 2022-03-23 NOTE — ED Notes (Signed)
PATIENT HAS PUR-WICK ON AND MONITOR

## 2022-03-23 NOTE — Evaluation (Signed)
Occupational Therapy Evaluation Patient Details Name: Christina [name] MRN: [id number] DOB: [birth year] Today's Date: 03/23/2022   History of Present Illness 81 y.o. female presents to [facility] hospital on 03/22/2022 after being found down on the ground, incontinent of urine. Pt reports weakness and nausea leading up to a fall. UA with concern for UTI. PMH includes breast cancer s/p partial mastectomy, CVA, DMII, fibromyalgia, HTN, HLD.   Clinical Impression   Patient admitted for the diagnosis above.  Deficits impacting independence are listed below.  Currently she is needing up to Min to Mod A for basic mobility and ADL completion from a sit to stand level.  Patient has limited supports at home, and OT recommends SNF for short term rehab prior to returning home.  OT will follow in the acute setting.        Recommendations for follow up therapy are one component of a multi-disciplinary discharge planning process, led by the attending physician.  Recommendations may be updated based on patient status, additional functional criteria and insurance authorization.   Follow Up Recommendations  Skilled nursing-short term rehab (<3 hours/day)    Assistance Recommended at Discharge Frequent or constant Supervision/Assistance  Patient can return home with the following A lot of help with bathing/dressing/bathroom;A lot of help with walking and/or transfers;Assistance with cooking/housework;Assist for transportation    Functional Status Assessment  Patient has had a recent decline in their functional status and demonstrates the ability to make significant improvements in function in a reasonable and predictable amount of time.  Equipment Recommendations  None recommended by OT    Recommendations for Other Services       Precautions / Restrictions Precautions Precautions: Fall Restrictions Weight Bearing Restrictions: No      Mobility Bed Mobility Overal bed mobility: Needs Assistance Bed Mobility:  Supine to Sit     Supine to sit: Min assist          Transfers Overall transfer level: Needs assistance Equipment used: Rolling walker (2 wheels) Transfers: Sit to/from Stand, Bed to chair/wheelchair/BSC Sit to Stand: Min assist     Step pivot transfers: Min assist, Mod assist            Balance Overall balance assessment: Needs assistance Sitting-balance support: No upper extremity supported, Feet supported Sitting balance-Leahy Scale: Fair     Standing balance support: Reliant on assistive device for balance Standing balance-Leahy Scale: Poor                             ADL either performed or assessed with clinical judgement   ADL Overall ADL's : Needs assistance/impaired Eating/Feeding: Set up;Sitting   Grooming: Wash/dry hands;Set up;Sitting   Upper Body Bathing: Set up;Sitting   Lower Body Bathing: Moderate assistance;Sit to/from stand   Upper Body Dressing : Minimal assistance;Sitting   Lower Body Dressing: Moderate assistance;Sit to/from stand   Toilet Transfer: Rolling walker (2 wheels);Regular Toilet;Moderate assistance   Toileting- Clothing Manipulation and Hygiene: Moderate assistance;Sit to/from stand               Vision Patient Visual Report: No change from baseline       Perception Perception Perception: Within Functional Limits   Praxis Praxis Praxis: Intact    Pertinent Vitals/Pain Pain Assessment Pain Assessment: Faces Faces Pain Scale: Hurts a little bit Pain Location: shoulder and BLE Pain Descriptors / Indicators: Aching, Tender Pain Intervention(s): Monitored during session     Hand Dominance Right   Extremity/Trunk  Assessment Upper Extremity Assessment Upper Extremity Assessment: Generalized weakness   Lower Extremity Assessment Lower Extremity Assessment: Defer to PT evaluation   Cervical / Trunk Assessment Cervical / Trunk Assessment: Kyphotic   Communication Communication Communication: No  difficulties   Cognition Arousal/Alertness: Awake/alert Behavior During Therapy: Restless Overall Cognitive Status: No family/caregiver present to determine baseline cognitive functioning Area of Impairment: Problem solving, Memory, Attention                   Current Attention Level: Selective Memory: Decreased short-term memory       Problem Solving: Slow processing, Requires verbal cues       General Comments       Exercises     Shoulder Instructions      Home Living Family/patient expects to be discharged to:: Private residence Living Arrangements: Alone Available Help at Discharge: Family;Available PRN/intermittently Type of Home: Independent living facility Home Access: Level entry     Home Layout: One level     Bathroom Shower/Tub: Occupational psychologist: Handicapped height Bathroom Accessibility: Yes   Home Equipment: Rollator (4 wheels);Shower seat - built in;Grab bars - toilet;Grab bars - tub/shower          Prior Functioning/Environment Prior Level of Function : Independent/Modified Independent;Other (comment)                        OT Problem List: Decreased strength;Decreased range of motion;Decreased activity tolerance;Impaired balance (sitting and/or standing);Decreased cognition;Decreased safety awareness;Pain      OT Treatment/Interventions: Self-care/ADL training;Therapeutic activities;DME and/or AE instruction;Patient/family education;Balance training    OT Goals(Current goals can be found in the care plan section) Acute Rehab OT Goals Patient Stated Goal: Hoping to return home OT Goal Formulation: With patient Time For Goal Achievement: 04/06/22 Potential to Achieve Goals: Good  OT Frequency: Min 2X/week    Co-evaluation              AM-PAC OT "6 Clicks" Daily Activity     Outcome Measure Help from another person eating meals?: None Help from another person taking care of personal grooming?:  None Help from another person toileting, which includes using toliet, bedpan, or urinal?: A Lot Help from another person bathing (including washing, rinsing, drying)?: A Lot Help from another person to put on and taking off regular upper body clothing?: A Little Help from another person to put on and taking off regular lower body clothing?: A Lot 6 Click Score: 17   End of Session Equipment Utilized During Treatment: Rolling walker (2 wheels) Nurse Communication: Mobility status  Activity Tolerance: Patient tolerated treatment well Patient left: in chair;with call bell/phone within reach;with chair alarm set  OT Visit Diagnosis: Unsteadiness on feet (R26.81);Muscle weakness (generalized) (M62.81);Pain Pain - part of body: Shoulder                Time: 5465-6812 OT Time Calculation (min): 17 min Charges:  OT General Charges $OT Visit: 1 Visit OT Evaluation $OT Eval Moderate Complexity: 1 Mod  03/23/2022  RP, OTR/L  Acute Rehabilitation Services  Office:  [phone]   Metta Clines 03/23/2022, 3:46 PM

## 2022-03-24 DIAGNOSIS — W19XXXA Unspecified fall, initial encounter: Secondary | ICD-10-CM | POA: Diagnosis not present

## 2022-03-24 DIAGNOSIS — N390 Urinary tract infection, site not specified: Secondary | ICD-10-CM | POA: Diagnosis not present

## 2022-03-24 DIAGNOSIS — B961 Klebsiella pneumoniae [K. pneumoniae] as the cause of diseases classified elsewhere: Secondary | ICD-10-CM

## 2022-03-24 LAB — CBC
HCT: 36.1 % (ref 36.0–46.0)
Hemoglobin: 12.2 g/dL (ref 12.0–15.0)
MCH: 30.8 pg (ref 26.0–34.0)
MCHC: 33.8 g/dL (ref 30.0–36.0)
MCV: 91.2 fL (ref 80.0–100.0)
Platelets: 239 10*3/uL (ref 150–400)
RBC: 3.96 MIL/uL (ref 3.87–5.11)
RDW: 12.9 % (ref 11.5–15.5)
WBC: 10.3 10*3/uL (ref 4.0–10.5)
nRBC: 0 % (ref 0.0–0.2)

## 2022-03-24 LAB — URINE CULTURE: Culture: >=100000 — AB

## 2022-03-24 LAB — BASIC METABOLIC PANEL
Anion gap: 7 (ref 5–15)
BUN: 15 mg/dL (ref 8–23)
CO2: 23 mmol/L (ref 22–32)
Calcium: 8.4 mg/dL — ABNORMAL LOW (ref 8.9–10.3)
Chloride: 108 mmol/L (ref 98–111)
Creatinine, Ser: 0.54 mg/dL (ref 0.44–1.00)
GFR, Estimated: >60 mL/min (ref >60)
Glucose, Bld: 140 mg/dL — ABNORMAL HIGH (ref 70–99)
Potassium: 3.9 mmol/L (ref 3.5–5.1)
Sodium: 138 mmol/L (ref 135–145)

## 2022-03-24 LAB — GLUCOSE, CAPILLARY: Glucose-Capillary: 127 mg/dL — ABNORMAL HIGH (ref 70–99)

## 2022-03-24 MED ORDER — METFORMIN HCL ER 500 MG PO TB24
500.0000 mg | ORAL_TABLET | Freq: Every day | ORAL | Status: DC
  Administered 2022-03-24 – 2022-03-25 (×2): 500 mg via ORAL
  Filled 2022-03-24 (×4): qty 1

## 2022-03-24 NOTE — Discharge Summary (Addendum)
Name: Ellasyn Swilling MRN: [id number] DOB: [birth year] 81 y.o. PCP: Shirline Frees, MD  Date of Admission: 03/22/2022  9:48 AM Date of Discharge: 03/25/2022 Attending Physician: Lucious Groves, DO  Discharge Diagnosis: Principal Problem:   Sepsis secondary to UTI [facility]) Active Problems:   Essential hypertension, benign   DM (diabetes mellitus) ([place])   Hyperlipemia   Unwitnessed fall   Acute encephalopathy   Rhabdomyolysis   Pressure injury of skin   Discharge Medications: Allergies as of 03/25/2022       Reactions   Ciprofloxacin    Numbness    Citalopram    Intolerant   Codeine    Maxzide [hydrochlorothiazide W-triamterene]    Septra Ds [sulfamethoxazole-trimethoprim] Rash        Medication List     STOP taking these medications    glimepiride 2 MG tablet Commonly known as: AMARYL   hydrochlorothiazide 12.5 MG tablet Commonly known as: HYDRODIURIL   irbesartan 300 MG tablet Commonly known as: AVAPRO       TAKE these medications    ALPRAZolam 0.5 MG tablet Commonly known as: XANAX Take 0.5 mg by mouth 3 (three) times daily as needed for anxiety.   aspirin 81 MG tablet Take 81 mg by mouth daily.   cefdinir 300 MG capsule Commonly known as: OMNICEF Take 1 capsule (300 mg total) by mouth every 12 (twelve) hours for 4 days.   clobetasol ointment 0.05 % Commonly known as: TEMOVATE Apply 1 Application topically 2 (two) times daily.   diclofenac Sodium 1 % Gel Commonly known as: VOLTAREN Apply 4 g topically 4 (four) times daily as needed (For knee pain).   metFORMIN 500 MG 24 hr tablet Commonly known as: GLUCOPHAGE-XR Take 500 mg by mouth daily.   pantoprazole 40 MG tablet Commonly known as: PROTONIX Take 40 mg by mouth daily.   pravastatin 20 MG tablet Commonly known as: PRAVACHOL Take 20 mg by mouth daily.   traMADol 50 MG tablet Commonly known as: ULTRAM Take 50 mg by mouth daily as needed for pain.               Durable  Medical Equipment  (From admission, onward)           Start     Ordered   03/25/22 1054  DME Walker  Once       Question Answer Comment  Walker: With 5 Inch Wheels   Patient needs a walker to treat with the following condition Fall      03/25/22 1059   03/25/22 1054  DME 3-in-1  Once        03/25/22 1059              Discharge Care Instructions  (From admission, onward)           Start     Ordered   03/25/22 0000  Discharge wound care:       Comments: Per facility protocol.   03/25/22 1059            Disposition and follow-up:   Ms.Onedia Michelle is a 81 y.o. year old female admitted to [facility] for sepsis and metabolic encephalopathy due to Klebsiella pneumonia UTI. They were discharged from [facility] after treatment with IV ceftriaxone in Good condition.  At the hospital follow up visit please address:  Klebsiella pneumonia acute cystitis, complicated by septic and metabolic encephalopathy Patient's condition improved rapidly after initiation of IV ceftriaxone.  She was  discharged on 4 days of Cefdnir to complete 7 days course until March 29, 2022  Unwitnessed fall with prolonged downtime Found down after unwitnessed fall the night before likely due to metabolic encephalopathy secondary to UTI. Discharged to SNF for acute rehab. - Follow-up need for PT or home safety assessment  Hypertension Holding hydrochlorothiazide and irbesartan due to low normal blood pressure in the hospital. -Please resume at follow-up if appropriate  Type 2 diabetes -Resume metformin -Holding glimepiride due to well-controlled diabetes and avoid risk of hypoglycemia  Blood work needed at follow-up: BMP  Pending blood work that need to be followed up: Blood culture  Follow-up Appointments:  Contact information for follow-up providers     Shirline Frees, MD. Schedule an appointment as soon as possible for a visit in 1 week(s).    Specialty: Family Medicine Contact information: [place] [address] [phone]              Contact information for after-discharge care     Destination     [facility] Preferred SNF .   Service: Skilled Nursing Contact information: [place] Hospital Course by problem list:  81 year old female presents to ED after fall with prolonged downtime, found to be tachycardic with elevated lactate with symptoms and UA suggestive of acute UTI.  Treated for sepsis complicated by metabolic encephalopathy with rapid improvement in her symptoms.  Sepsis Acute complicated cystitis due to Klebsiella pneumonia Acute metabolic encephalopathy Unwitnessed fall Patient presented to the hospital after being found down on the floor in her assisted living facility.  Last time anyone spoke to her was the day before.  Estimated downtime around 12 hours.  On arrival to the emergency department she is afebrile but tachycardic.  Patient is confused and responds inappropriately to questions.  Patient is unable to provide any information about the event prior to her fall. She endorses suprapubic pain and dysuria.   CT head, neck, face and hip and shoulder x-rays negative for fracture or dislocation. Laboratory work-up notable for leukocytosis to 18.9 and lactate elevated to 2.9.  UA with bacteriuria and pyuria.  Blood and urine cultures were obtained.  Code sepsis was initiated and patient received appropriate IV fluids per protocol. IV ceftriaxone was started with rapid improvement of her mental status.  Blood culture day 3.  Urine culture came back with Klebsiella pneumonia only resistant to Ampicillin.  She received 3 days of IV ceftriaxone and transition to 4 more days of cefdinir.  She was discharged to SNF for physical therapy after 3 days in the hospital.    Discharge  Exam:  Subjective:  Patient reports doing much better.  She reports diarrhea yesterday after being on antibiotic.  Otherwise she is eager to go to rehab facility for physical therapy yes   Blood pressure 123/72, pulse 80, temperature 98 F (36.7 C), temperature source Oral, resp. rate 17, height '5\' 3"'$  (1.6 m), weight 67.4 kg, SpO2 95 %. Physical Exam Constitutional:      General: She is not in acute distress.    Appearance: She is not ill-appearing.  HENT:     Head: Normocephalic.     Mouth/Throat:     Mouth: Mucous membranes are moist.  Eyes:     General:        Right eye:  No discharge.        Left eye: No discharge.     Conjunctiva/sclera: Conjunctivae normal.  Cardiovascular:     Rate and Rhythm: Normal rate and regular rhythm.  Pulmonary:     Effort: Pulmonary effort is normal. No respiratory distress.  Musculoskeletal:        General: Normal range of motion.     Cervical back: Normal range of motion.  Skin:    General: Skin is warm.     Coloration: Skin is not jaundiced.  Neurological:     Mental Status: She is alert. Mental status is at baseline.  Psychiatric:        Mood and Affect: Mood normal.        Behavior: Behavior normal.      Pertinent studies and procedures:     Latest Ref Rng & Units 03/24/2022    3:46 AM 03/23/2022    3:14 AM 03/22/2022   10:05 AM  CBC  WBC 4.0 - 10.5 K/uL 10.3  13.9  18.9   Hemoglobin 12.0 - 15.0 g/dL 12.2  13.0  16.0   Hematocrit 36.0 - 46.0 % 36.1  39.2  47.6   Platelets 150 - 400 K/uL 239  251  332     Latest Reference Range & Units 03/22/22 10:05 03/22/22 13:21  Lactic Acid, Venous 0.5 - 1.9 mmol/L 2.5 (HH) 2.2 (HH)  (HH): Data is critically high  Urinalysis    Component Value Date/Time   COLORURINE AMBER (A) 03/22/2022 1130   APPEARANCEUR CLOUDY (A) 03/22/2022 1130   LABSPEC 1.020 03/22/2022 1130   PHURINE 5.0 03/22/2022 1130   GLUCOSEU NEGATIVE 03/22/2022 1130   HGBUR LARGE (A) 03/22/2022 1130   BILIRUBINUR NEGATIVE  03/22/2022 1130   KETONESUR 5 (A) 03/22/2022 1130   PROTEINUR 30 (A) 03/22/2022 1130   UROBILINOGEN 0.2 04/29/2012 1855   NITRITE NEGATIVE 03/22/2022 1130   LEUKOCYTESUR LARGE (A) 03/22/2022 1130   Urine culture with Klebsiella pneumonia, sensitive to Bactrim.  CT head shows no intracranial hemorrhage or skull fracture CT cervical spine showed no acute fracture Pelvis x-ray showed no acute fracture or hip dislocation Bilateral shoulder x-ray showed no acute fracture or dislocation  Discharge Instructions:   Discharge Instructions          Ms. Sipos,  It was a pleasure taking care of you during this admission.  You were hospitalized for confusion from a urinary tract infection.  I am glad that you are doing much better on antibiotics.  You will continue 4 more days of oral antibiotic to finish a 7-day course.  Please continue to work with physical therapy at the rehab center to regain your strength.  I am holding your blood pressure medications including Hydrochlorothiazide and Irbesartan because your blood pressure is still on the lower side.  Your regular doctor can resume those if appropriate.  I am also holding your Glimepiride because your diabetes is well controlled.  This medication can increase the risk of having low blood sugar.  Take care,  Dr. Alfonse Spruce  Signed: Gaylan Gerold, DO Internal Medicine Residency My pager: [phone]

## 2022-03-24 NOTE — Progress Notes (Signed)
Subjective:   Hospital day 1.  Interval events: None.  Feeling better day by day. Still reports some L shoulder pain and bilateral knee pain. Still reports some burning with urination.   Objective:  Vital signs: Blood pressure (!) 110/49, pulse 94, temperature 98.7 F (37.1 C), temperature source Oral, resp. rate 20, height '5\' 3"'$  (1.6 m), weight 67.6 kg, SpO2 97 %.  Physical exam: Constitutional: Alert female nontoxic appearing.  No acute distress. Cardiovascular: Regular rate and rhythm. Pulmonary: Normal work of breathing.  No crackles. Skin: Warm and dry. Extremities: Impaired range of motion bilateral shoulders.  Pain on passive range of motion bilateral shoulders. Neuro: Alert. No gross focal deficits.   Intake/Output Summary (Last 24 hours) at 03/24/2022 1304 Last data filed at 03/24/2022 0900 Gross per 24 hour  Intake 680 ml  Output 1700 ml  Net -1020 ml    Pertinent Labs:    Latest Ref Rng & Units 03/24/2022    3:46 AM 03/23/2022    3:14 AM 03/22/2022   10:05 AM  CBC  WBC 4.0 - 10.5 K/uL 10.3  13.9  18.9   Hemoglobin 12.0 - 15.0 g/dL 12.2  13.0  16.0   Hematocrit 36.0 - 46.0 % 36.1  39.2  47.6   Platelets 150 - 400 K/uL 239  251  332        Latest Ref Rng & Units 03/24/2022    3:46 AM 03/23/2022    3:14 AM 03/22/2022   10:05 AM  CMP  Glucose 70 - 99 mg/dL 140  110  193   BUN 8 - 23 mg/dL '15  17  25   '$ Creatinine 0.44 - 1.00 mg/dL 0.54  0.67  0.95   Sodium 135 - 145 mmol/L 138  141  138   Potassium 3.5 - 5.1 mmol/L 3.9  3.4  3.6   Chloride 98 - 111 mmol/L 108  106  101   CO2 22 - 32 mmol/L '23  27  24   '$ Calcium 8.9 - 10.3 mg/dL 8.4  8.2  9.2   Total Protein 6.5 - 8.1 g/dL   6.8   Total Bilirubin 0.3 - 1.2 mg/dL   1.1   Alkaline Phos 38 - 126 U/L   55   AST 15 - 41 U/L   112   ALT 0 - 44 U/L   41   Blood cultures negative x2 days Urine cultures with Assessment/Plan:   Principal Problem:   Sepsis secondary to UTI ([place]) Active  Problems:   Essential hypertension, benign   DM (diabetes mellitus) ([place])   Hyperlipemia   Unwitnessed fall   Acute encephalopathy   Rhabdomyolysis   Pressure injury of skin   Altered mental status   Patient Summary: Christina [name] is a 81 y.o. female with a history of breast cancer status post partial mastectomy in 2018, CVA in 2013 with residual weakness, type 2 diabetes, presents after an unwitnessed fall with confusion, found to have a UTI and admitted for sepsis and encephalopathy secondary to UTI.  Her condition is improving and she is ready for discharge pending SNF availability for rehabilitation.  Sepsis with encephalopathy, resolved Klebsiella pneumonia urinary tract infection, improving Mental status continues to improve significantly since yesterday.  Afebrile and hemodynamically stable.  Urine cultures returned with Klebsiella pneumonia susceptible to Bactrim.  Pending SNF placement for rehab. Discharge on Bactrim until 03/29/2022. - Continue  ceftriaxone - Continue Tylenol as needed for pain and fever - Follow-up urine culture susceptibilities and blood cultures  Unwitnessed fall with prolonged time down Due to septic/metabolic encephalopathy.  PT/OT recommending SNF for short term rehabilitation. Discharge pending SNF availability.  Type 2 diabetes, well controlled - Restart metformin - Discontinue sliding scale insulin and 4 times daily CBG checks  ASCVD Remote history of CVA. - Continue pravastatin 20 mg daily  Diet: Normal IVF: None VTE: Enoxaparin Code: Full PT/OT recs: SNF for Subacute PT, bedside commode and walker. TOC recs: SNF pending availability and insurance authorization. Family Update: family at bedside.  Dispo: Anticipated discharge to Rehab in 1 days pending SNF availability and insurance authorization.   Nani Gasser MD 03/24/2022, 1:04 PM  Pager: [phone] After 5pm on weekdays and 1pm on weekends: On Call pager: [phone]

## 2022-03-24 NOTE — Progress Notes (Signed)
Mobility Specialist Progress Note:   03/24/22 1500  Therapy Vitals  Resp 19  Mobility  Activity Transferred from bed to chair  Activity Response Tolerated well  Distance Ambulated (ft) 5 ft  $Mobility charge 1 Mobility  Level of Assistance Minimal assist, patient does 75% or more  Assistive Device Front wheel walker  Mobility Referral Yes   Pt eager for mobility session, bed soiled in urine upon arrival. Pt able to stand with minA for pericare, then take steps to chair. Left with all needs met, chair alarm on.   Nelta Numbers Acute Rehab Secure Chat or Office Phone: [phone]

## 2022-03-24 NOTE — TOC Progression Note (Addendum)
Transition of Care [facility]) - Progression Note    Patient Details  Name: Christina [name] MRN: [id number] Date of Birth: 11-13-1940  Transition of Care [facility]) CM/SW [place], [place] Phone Number: 03/24/2022, 11:34 AM  Clinical Narrative:     CSW met with pt and family member at bed side and provided bed offers. Pt states her daughter will be here this evening and they will review. TOC will continue to follow for DC needs.  1:40 CSW spoke with [place], pt's dtr, who had discussed with her mother, and they have chosen [facility]. CSW followed up with facility, they can accept over the weekend. CSW to start authorization. Daughter states she can transport pt, team to let her know when DC is happening. TOC will continue to follow for DC needs. Call April [phone]) Will go to room 202 Approved [id number] 03/25/2022-03/28/2022  Expected Discharge Plan: [place] Barriers to Discharge: Continued Medical Work up, SNF Pending bed offer, Ship broker  Expected Discharge Plan and Services Expected Discharge Plan: [place] In-house Referral: Clinical Social Work     Living arrangements for the past 2 months: [place]                                       Social Determinants of Health (SDOH) Interventions    Readmission Risk Interventions     No data to display

## 2022-03-25 DIAGNOSIS — E119 Type 2 diabetes mellitus without complications: Secondary | ICD-10-CM | POA: Diagnosis not present

## 2022-03-25 DIAGNOSIS — R21 Rash and other nonspecific skin eruption: Secondary | ICD-10-CM | POA: Diagnosis not present

## 2022-03-25 DIAGNOSIS — Z79899 Other long term (current) drug therapy: Secondary | ICD-10-CM | POA: Diagnosis not present

## 2022-03-25 DIAGNOSIS — I10 Essential (primary) hypertension: Secondary | ICD-10-CM | POA: Diagnosis not present

## 2022-03-25 DIAGNOSIS — B961 Klebsiella pneumoniae [K. pneumoniae] as the cause of diseases classified elsewhere: Secondary | ICD-10-CM | POA: Diagnosis not present

## 2022-03-25 DIAGNOSIS — A419 Sepsis, unspecified organism: Secondary | ICD-10-CM | POA: Diagnosis not present

## 2022-03-25 DIAGNOSIS — G4700 Insomnia, unspecified: Secondary | ICD-10-CM | POA: Diagnosis not present

## 2022-03-25 DIAGNOSIS — R300 Dysuria: Secondary | ICD-10-CM | POA: Diagnosis not present

## 2022-03-25 DIAGNOSIS — A4159 Other Gram-negative sepsis: Secondary | ICD-10-CM | POA: Diagnosis not present

## 2022-03-25 DIAGNOSIS — N390 Urinary tract infection, site not specified: Secondary | ICD-10-CM | POA: Diagnosis not present

## 2022-03-25 DIAGNOSIS — M25572 Pain in left ankle and joints of left foot: Secondary | ICD-10-CM | POA: Diagnosis not present

## 2022-03-25 DIAGNOSIS — G9341 Metabolic encephalopathy: Secondary | ICD-10-CM | POA: Diagnosis not present

## 2022-03-25 DIAGNOSIS — R55 Syncope and collapse: Secondary | ICD-10-CM | POA: Diagnosis not present

## 2022-03-25 DIAGNOSIS — W19XXXA Unspecified fall, initial encounter: Secondary | ICD-10-CM | POA: Diagnosis not present

## 2022-03-25 DIAGNOSIS — F419 Anxiety disorder, unspecified: Secondary | ICD-10-CM | POA: Diagnosis not present

## 2022-03-25 LAB — CULTURE, BLOOD (ROUTINE X 2): Culture: NO GROWTH

## 2022-03-25 MED ORDER — LOPERAMIDE HCL 2 MG PO CAPS
2.0000 mg | ORAL_CAPSULE | ORAL | Status: DC | PRN
  Administered 2022-03-25: 2 mg via ORAL
  Filled 2022-03-25: qty 1

## 2022-03-25 MED ORDER — ALPRAZOLAM 0.5 MG PO TABS: 0.5000 mg | ORAL_TABLET | Freq: Three times a day (TID) | ORAL | 0 refills | Status: AC | PRN

## 2022-03-25 MED ORDER — DICLOFENAC SODIUM 1 % EX GEL: 4.0000 g | Freq: Four times a day (QID) | CUTANEOUS | 1 refills | Status: DC | PRN

## 2022-03-25 MED ORDER — CEFDINIR 300 MG PO CAPS: 300.0000 mg | ORAL_CAPSULE | Freq: Two times a day (BID) | ORAL | 0 refills | Status: AC

## 2022-03-25 MED ORDER — CEFDINIR 300 MG PO CAPS
300.0000 mg | ORAL_CAPSULE | Freq: Two times a day (BID) | ORAL | Status: DC
  Administered 2022-03-25: 300 mg via ORAL
  Filled 2022-03-25 (×2): qty 1

## 2022-03-25 MED ORDER — TRAMADOL HCL 50 MG PO TABS: 50.0000 mg | ORAL_TABLET | Freq: Every day | ORAL | 0 refills | Status: AC | PRN

## 2022-03-25 NOTE — TOC Transition Note (Signed)
Transition of Care [facility]) - CM/SW Discharge Note   Patient Details  Name: Christina [name] MRN: [id number] Date of Birth: 1940/12/21  Transition of Care [facility]) CM/SW Contact:  Amador Cunas, [place] Phone Number: 03/25/2022, 12:14 PM   Clinical Narrative:   Pt for dc to [place] today. Spoke to April in admissions who confirmed they are prepared to admit pt to room 202. Pt's dtr aware of dc and plans to transport pt via private vehicle. RN provided with number for report. SW signing off at dc.   Wandra Feinstein, MSW, LCSW [phone] (coverage)      Final next level of care: [place] Barriers to Discharge: No Barriers Identified   Patient Goals and CMS Choice Patient states their goals for this hospitalization and ongoing recovery are:: To return to [facility].gov Compare Post Acute Care list provided to:: Patient Choice offered to / list presented to : Adult Children  Discharge Placement              Patient chooses bed at: [place], [place] Patient to be transferred to facility by: family transport Name of family member notified: Christina [name] Patient and family notified of of transfer: 03/25/22  Discharge Plan and Services In-house Referral: Clinical Social Work                                   Social Determinants of Health ([place]) Interventions     Readmission Risk Interventions     No data to display

## 2022-03-25 NOTE — Progress Notes (Signed)
DISCHARGE NOTE SNF Christina [name] to be discharged Skilled nursing facility per MD order. Patient verbalized understanding. Report called to [facility] LPN.  Skin clean, dry and intact with skin break down sacral area noted, bruising to knees and chin from previous fall. IV catheter discontinued intact. Site without signs and symptoms of complications. Dressing and pressure applied. Pt denies pain at the site currently. No complaints noted.  Patient free of lines, drains, and wounds.   Discharge packet assembled. An After Visit Summary (AVS) was printed and given to the EMS personnel. Patient escorted via stretcher and discharged to [facility] via ambulance. Report called to accepting facility; all questions and concerns addressed.   Virgina Jock, RN

## 2022-03-26 LAB — CULTURE, BLOOD (ROUTINE X 2)

## 2022-03-27 LAB — CULTURE, BLOOD (ROUTINE X 2)
Culture: NO GROWTH
Special Requests: ADEQUATE

## 2022-04-04 NOTE — Progress Notes (Signed)
  Progress Note   Date: 03/30/2022  Patient Name: Christina [name]        MRN#: [id number]   Clarification of diagnosis:  Severe Sepsis due to Klebsiella pneumonia

## 2022-04-07 DIAGNOSIS — G4700 Insomnia, unspecified: Secondary | ICD-10-CM | POA: Diagnosis not present

## 2022-04-07 DIAGNOSIS — R55 Syncope and collapse: Secondary | ICD-10-CM | POA: Diagnosis not present

## 2022-04-17 DIAGNOSIS — R2689 Other abnormalities of gait and mobility: Secondary | ICD-10-CM | POA: Diagnosis not present

## 2022-04-17 DIAGNOSIS — Z9181 History of falling: Secondary | ICD-10-CM | POA: Diagnosis not present

## 2022-04-17 DIAGNOSIS — R262 Difficulty in walking, not elsewhere classified: Secondary | ICD-10-CM | POA: Diagnosis not present

## 2022-04-17 DIAGNOSIS — M6281 Muscle weakness (generalized): Secondary | ICD-10-CM | POA: Diagnosis not present

## 2022-04-25 DIAGNOSIS — W010XXA Fall on same level from slipping, tripping and stumbling without subsequent striking against object, initial encounter: Secondary | ICD-10-CM | POA: Diagnosis not present

## 2022-04-25 DIAGNOSIS — I10 Essential (primary) hypertension: Secondary | ICD-10-CM | POA: Diagnosis not present

## 2022-04-25 DIAGNOSIS — E785 Hyperlipidemia, unspecified: Secondary | ICD-10-CM | POA: Diagnosis not present

## 2022-04-25 DIAGNOSIS — E1121 Type 2 diabetes mellitus with diabetic nephropathy: Secondary | ICD-10-CM | POA: Diagnosis not present

## 2022-04-26 DIAGNOSIS — R300 Dysuria: Secondary | ICD-10-CM | POA: Diagnosis not present

## 2022-05-04 DIAGNOSIS — I10 Essential (primary) hypertension: Secondary | ICD-10-CM | POA: Diagnosis not present

## 2022-05-04 DIAGNOSIS — E119 Type 2 diabetes mellitus without complications: Secondary | ICD-10-CM | POA: Diagnosis not present

## 2022-05-04 DIAGNOSIS — Z87440 Personal history of urinary (tract) infections: Secondary | ICD-10-CM | POA: Diagnosis not present

## 2022-05-04 DIAGNOSIS — E782 Mixed hyperlipidemia: Secondary | ICD-10-CM | POA: Diagnosis not present

## 2022-05-09 DIAGNOSIS — R52 Pain, unspecified: Secondary | ICD-10-CM | POA: Diagnosis not present

## 2022-06-19 DEATH — deceased
